# Patient Record
Sex: Female | Born: 1955 | Race: White | Hispanic: No | Marital: Married | Smoking: Current every day smoker
Health system: Southern US, Community
[De-identification: ages and names within clinical notes are randomized; demographics above are authoritative.]

## PROBLEM LIST (undated history)

## (undated) DIAGNOSIS — M1990 Unspecified osteoarthritis, unspecified site: Secondary | ICD-10-CM

## (undated) HISTORY — PX: EYE SURGERY: SHX253

## (undated) HISTORY — PX: HAND SURGERY: SHX662

---

## 2004-08-27 ENCOUNTER — Other Ambulatory Visit: Admission: RE | Admit: 2004-08-27 | Discharge: 2004-08-27 | Payer: Self-pay | Admitting: Obstetrics and Gynecology

## 2004-09-24 ENCOUNTER — Ambulatory Visit (HOSPITAL_COMMUNITY): Admission: RE | Admit: 2004-09-24 | Discharge: 2004-09-24 | Payer: Self-pay | Admitting: Obstetrics and Gynecology

## 2004-10-08 ENCOUNTER — Encounter: Admission: RE | Admit: 2004-10-08 | Discharge: 2004-10-08 | Payer: Self-pay | Admitting: Obstetrics and Gynecology

## 2005-04-21 ENCOUNTER — Encounter: Admission: RE | Admit: 2005-04-21 | Discharge: 2005-04-21 | Payer: Self-pay | Admitting: Obstetrics and Gynecology

## 2005-09-29 ENCOUNTER — Encounter: Admission: RE | Admit: 2005-09-29 | Discharge: 2005-09-29 | Payer: Self-pay | Admitting: Obstetrics and Gynecology

## 2005-10-13 ENCOUNTER — Encounter: Admission: RE | Admit: 2005-10-13 | Discharge: 2005-10-13 | Payer: Self-pay | Admitting: Obstetrics and Gynecology

## 2006-11-01 ENCOUNTER — Ambulatory Visit (HOSPITAL_COMMUNITY): Admission: RE | Admit: 2006-11-01 | Discharge: 2006-11-01 | Payer: Self-pay | Admitting: Orthopedic Surgery

## 2006-11-17 ENCOUNTER — Encounter: Admission: RE | Admit: 2006-11-17 | Discharge: 2006-11-17 | Payer: Self-pay | Admitting: Internal Medicine

## 2008-08-31 ENCOUNTER — Encounter: Payer: Self-pay | Admitting: Rheumatology

## 2009-03-15 ENCOUNTER — Encounter: Admission: RE | Admit: 2009-03-15 | Discharge: 2009-03-15 | Payer: Self-pay | Admitting: Orthopedic Surgery

## 2009-10-08 ENCOUNTER — Ambulatory Visit (HOSPITAL_COMMUNITY): Admission: RE | Admit: 2009-10-08 | Discharge: 2009-10-08 | Payer: Self-pay | Admitting: Rheumatology

## 2009-10-15 ENCOUNTER — Encounter: Admission: RE | Admit: 2009-10-15 | Discharge: 2009-10-15 | Payer: Self-pay | Admitting: Orthopedic Surgery

## 2009-11-06 ENCOUNTER — Ambulatory Visit (HOSPITAL_BASED_OUTPATIENT_CLINIC_OR_DEPARTMENT_OTHER): Admission: RE | Admit: 2009-11-06 | Discharge: 2009-11-06 | Payer: Self-pay | Admitting: Orthopedic Surgery

## 2010-08-31 ENCOUNTER — Encounter: Payer: Self-pay | Admitting: Internal Medicine

## 2010-08-31 ENCOUNTER — Encounter: Payer: Self-pay | Admitting: Obstetrics and Gynecology

## 2011-12-13 DIAGNOSIS — H16069 Mycotic corneal ulcer, unspecified eye: Secondary | ICD-10-CM | POA: Insufficient documentation

## 2012-02-22 DIAGNOSIS — T86840 Corneal transplant rejection: Secondary | ICD-10-CM

## 2012-05-04 DIAGNOSIS — M069 Rheumatoid arthritis, unspecified: Secondary | ICD-10-CM | POA: Insufficient documentation

## 2012-08-12 DIAGNOSIS — Z947 Corneal transplant status: Secondary | ICD-10-CM | POA: Insufficient documentation

## 2013-03-13 DIAGNOSIS — H26499 Other secondary cataract, unspecified eye: Secondary | ICD-10-CM | POA: Insufficient documentation

## 2013-07-31 DIAGNOSIS — D2310 Other benign neoplasm of skin of unspecified eyelid, including canthus: Secondary | ICD-10-CM | POA: Insufficient documentation

## 2013-07-31 DIAGNOSIS — H02409 Unspecified ptosis of unspecified eyelid: Secondary | ICD-10-CM | POA: Insufficient documentation

## 2013-09-06 DIAGNOSIS — Z961 Presence of intraocular lens: Secondary | ICD-10-CM | POA: Insufficient documentation

## 2014-01-05 DIAGNOSIS — H04209 Unspecified epiphora, unspecified lacrimal gland: Secondary | ICD-10-CM | POA: Insufficient documentation

## 2014-01-18 DIAGNOSIS — H401190 Primary open-angle glaucoma, unspecified eye, stage unspecified: Secondary | ICD-10-CM | POA: Insufficient documentation

## 2014-01-18 DIAGNOSIS — H04563 Stenosis of bilateral lacrimal punctum: Secondary | ICD-10-CM | POA: Insufficient documentation

## 2014-02-16 DIAGNOSIS — H40003 Preglaucoma, unspecified, bilateral: Secondary | ICD-10-CM | POA: Insufficient documentation

## 2014-07-04 ENCOUNTER — Other Ambulatory Visit (HOSPITAL_COMMUNITY): Payer: Self-pay | Admitting: Rheumatology

## 2014-07-04 DIAGNOSIS — M659 Unspecified synovitis and tenosynovitis, unspecified site: Secondary | ICD-10-CM

## 2014-07-19 ENCOUNTER — Ambulatory Visit (HOSPITAL_COMMUNITY): Payer: BC Managed Care – PPO

## 2014-08-15 ENCOUNTER — Ambulatory Visit (HOSPITAL_COMMUNITY)
Admission: RE | Admit: 2014-08-15 | Discharge: 2014-08-15 | Disposition: A | Discharge status: Home / Self Care | Payer: BLUE CROSS/BLUE SHIELD | Source: Ambulatory Visit | Attending: Rheumatology | Admitting: Rheumatology

## 2014-08-15 DIAGNOSIS — M7989 Other specified soft tissue disorders: Secondary | ICD-10-CM | POA: Insufficient documentation

## 2014-08-15 DIAGNOSIS — M659 Unspecified synovitis and tenosynovitis, unspecified site: Secondary | ICD-10-CM

## 2014-08-15 DIAGNOSIS — M25571 Pain in right ankle and joints of right foot: Secondary | ICD-10-CM | POA: Diagnosis not present

## 2016-08-10 HISTORY — PX: FOOT SURGERY: SHX648

## 2016-10-13 ENCOUNTER — Encounter: Payer: Self-pay | Admitting: Rheumatology

## 2016-10-13 ENCOUNTER — Ambulatory Visit (INDEPENDENT_AMBULATORY_CARE_PROVIDER_SITE_OTHER): Payer: BLUE CROSS/BLUE SHIELD | Admitting: Rheumatology

## 2016-10-13 VITALS — BP 131/61 | HR 70 | Resp 13 | Ht 66.0 in | Wt 193.0 lb

## 2016-10-13 DIAGNOSIS — M158 Other polyosteoarthritis: Secondary | ICD-10-CM

## 2016-10-13 DIAGNOSIS — M069 Rheumatoid arthritis, unspecified: Secondary | ICD-10-CM

## 2016-10-13 DIAGNOSIS — Z79899 Other long term (current) drug therapy: Secondary | ICD-10-CM

## 2016-10-13 DIAGNOSIS — M159 Polyosteoarthritis, unspecified: Secondary | ICD-10-CM | POA: Insufficient documentation

## 2016-10-13 DIAGNOSIS — Z8679 Personal history of other diseases of the circulatory system: Secondary | ICD-10-CM

## 2016-10-13 DIAGNOSIS — M2560 Stiffness of unspecified joint, not elsewhere classified: Secondary | ICD-10-CM

## 2016-10-13 DIAGNOSIS — Z947 Corneal transplant status: Secondary | ICD-10-CM | POA: Diagnosis not present

## 2016-10-13 DIAGNOSIS — M0579 Rheumatoid arthritis with rheumatoid factor of multiple sites without organ or systems involvement: Secondary | ICD-10-CM

## 2016-10-13 DIAGNOSIS — Z8659 Personal history of other mental and behavioral disorders: Secondary | ICD-10-CM

## 2016-10-13 LAB — CBC WITH DIFFERENTIAL/PLATELET
BASOS PCT: 1 %
Basophils Absolute: 68 cells/uL (ref 0–200)
EOS ABS: 340 {cells}/uL (ref 15–500)
Eosinophils Relative: 5 %
HEMATOCRIT: 40.5 % (ref 35.0–45.0)
Hemoglobin: 13.3 g/dL (ref 11.7–15.5)
LYMPHS ABS: 1972 {cells}/uL (ref 850–3900)
Lymphocytes Relative: 29 %
MCH: 31.7 pg (ref 27.0–33.0)
MCHC: 32.8 g/dL (ref 32.0–36.0)
MCV: 96.4 fL (ref 80.0–100.0)
MONO ABS: 544 {cells}/uL (ref 200–950)
MPV: 9.2 fL (ref 7.5–12.5)
Monocytes Relative: 8 %
NEUTROS ABS: 3876 {cells}/uL (ref 1500–7800)
Neutrophils Relative %: 57 %
Platelets: 308 10*3/uL (ref 140–400)
RBC: 4.2 MIL/uL (ref 3.80–5.10)
RDW: 13.8 % (ref 11.0–15.0)
WBC: 6.8 10*3/uL (ref 3.8–10.8)

## 2016-10-13 LAB — COMPLETE METABOLIC PANEL WITH GFR
ALT: 13 U/L (ref 6–29)
AST: 15 U/L (ref 10–35)
Albumin: 3.7 g/dL (ref 3.6–5.1)
Alkaline Phosphatase: 61 U/L (ref 33–130)
BILIRUBIN TOTAL: 0.2 mg/dL (ref 0.2–1.2)
BUN: 18 mg/dL (ref 7–25)
CALCIUM: 9 mg/dL (ref 8.6–10.4)
CO2: 28 mmol/L (ref 20–31)
CREATININE: 0.66 mg/dL (ref 0.50–1.05)
Chloride: 105 mmol/L (ref 98–110)
GFR, Est Non African American: >89 mL/min (ref >=60)
Glucose, Bld: 82 mg/dL (ref 65–99)
Potassium: 4.7 mmol/L (ref 3.5–5.3)
Sodium: 139 mmol/L (ref 135–146)
TOTAL PROTEIN: 6.2 g/dL (ref 6.1–8.1)

## 2016-10-13 MED ORDER — SULFASALAZINE 500 MG PO TABS: 500.0000 mg | ORAL_TABLET | Freq: Four times a day (QID) | ORAL | 2 refills | Status: DC

## 2016-10-13 MED ORDER — PREDNISONE 5 MG PO TABS: ORAL_TABLET | ORAL | 0 refills | Status: DC

## 2016-10-13 NOTE — Progress Notes (Signed)
Office Visit Note  Patient: Jamie Mccall             Date of Birth: 1956-06-08           MRN: [id number]             PCP: Tula Nakayama Referring: No ref. provider found Visit Date: 10/13/2016 Occupation: @GUAROCC @    Subjective:  Pain of the Left Ankle   History of Present Illness: Jamie Mccall is a 61 y.o. female  Last seen in our office 02/01/2016.  Patient is off of all medications for immunosuppression for her rheumatoid arthritis.  Note that she has failed Humira in the past. She is also failed Enbrel. She initially had done well for about 2 years and then she stopped the medication she was doing so well. Then she restarted the medication and came down with pneumonia. She was "deathly sick" according to the patient. She does not wish to be on Biologics at this time. She is very afraid of catching pneumonia.  Currently she is better having left medial ankle pain and it's causing her problems ambulating.  Dr. Estanislado Pandy offered her Plaquenil back in January 2017 and she signed a handout and consent but patient elected not to take the medication because she states "it was ineffective for me in 2009".   Activities of Daily Living:  Patient reports morning stiffness for 30 minutes.   Patient Reports nocturnal pain.  Difficulty dressing/grooming: Reports Difficulty climbing stairs: Reports Difficulty getting out of chair: Reports Difficulty using hands for taps, buttons, cutlery, and/or writing: Reports   Review of Systems  Constitutional: Negative for fatigue.  HENT: Negative for mouth sores and mouth dryness.   Eyes: Negative for dryness.  Respiratory: Negative for shortness of breath.   Gastrointestinal: Negative for constipation and diarrhea.  Musculoskeletal: Negative for myalgias and myalgias.  Skin: Negative for sensitivity to sunlight.  Psychiatric/Behavioral: Negative for decreased concentration and sleep disturbance.    PMFS History:    Patient Active Problem List   Diagnosis Date Noted  . 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[facility]) 10/13/2016  . History of corneal transplant 10/13/2016  . High risk medication use 10/13/2016  . Degenerative joint disease involving multiple joints 10/13/2016  . History of hypertension 10/13/2016  . History of depression 10/13/2016  . Morning joint stiffness 10/13/2016    No past medical history on file.  No family history on file. No past surgical history on file. Social History   Social History Narrative  . No narrative on file     Objective: Vital Signs: BP 131/61   Pulse 70   Resp 13   Ht 5\' 6"  (1.676 m)   Wt 193 lb (87.5 kg)   BMI 31.15 kg/m    Physical Exam  Constitutional: She is oriented to person, place, and time. She appears well-developed and well-nourished.  HENT:  Head: Normocephalic and atraumatic.  Eyes: EOM are normal. Pupils are equal, round, and reactive to light.  Cardiovascular: Normal rate, regular rhythm and normal heart sounds.  Exam reveals no gallop and no friction rub.   No murmur heard. Pulmonary/Chest: Effort normal and breath sounds normal. She has no wheezes. She has no rales.  Abdominal: Soft. Bowel sounds are normal. She exhibits no distension. There is no tenderness. There is no guarding. No hernia.  Musculoskeletal: Normal range of motion. She exhibits no edema, tenderness or deformity.  Lymphadenopathy:    She has no cervical adenopathy.  Neurological: She is alert and oriented  to person, place, and time. Coordination normal.  Skin: Skin is warm and dry. Capillary refill takes less than 2 seconds. No rash noted.  Psychiatric: She has a normal mood and affect. Her behavior is normal.  Nursing note and vitals reviewed.    Musculoskeletal Exam:  Full range of motion of all joints Grip strength is equal and strong bilaterally For myalgia tender points are absent  CDAI Exam: CDAI Homunculus Exam:   Tenderness:  Right hand:  1st MCP, 2nd MCP and 3rd MCP Left hand: 1st MCP, 2nd MCP and 3rd MCP  Swelling:  Right hand: 1st MCP, 2nd MCP and 3rd MCP Left hand: 1st MCP, 2nd MCP and 3rd MCP  Joint Counts:  CDAI Tender Joint count: 6 CDAI Swollen Joint count: 6  Global Assessments:  Patient Global Assessment: 8 Provider Global Assessment: 8  CDAI Calculated Score: 28    Investigation: Findings:  May 2016:  CBC and comprehensive metabolic panel was normal  Last bone density was in 2013 with a T score of -1.8.  No visits with results within 6 Month(s) from this visit.  Latest known visit with results is:  Hospital Outpatient Visit on 11/06/2009  Component Date Value Ref Range Status  . Hemoglobin 11/06/2009 14.5  12.0 - 15.0 g/dL Final      Imaging: No results found.  Speciality Comments: 06/27/14 DR. DEV ONLY -KBD  pt states she will not use Enbrel again/ causes Bronchitis,  will discuss other options in Jan 2017    Procedures:  No procedures performed Allergies: Patient has no known allergies.   Assessment / Plan:     Visit Diagnoses: Rheumatoid arthritis involving multiple sites, unspecified rheumatoid factor presence (HCC) - +CCP,+ANA, +ARNP  History of corneal transplant - Right eye  High risk medication use - ?PLQ-200mg  1 tablet q.a.m. and half tablet p.m - Plan: CBC with Differential/Platelet, COMPLETE METABOLIC PANEL WITH GFR, CBC with Differential/Platelet, COMPLETE METABOLIC PANEL WITH GFR  Other osteoarthritis involving multiple joints  History of hypertension  History of depression  Morning joint stiffness    Plan: #1: Rheumatoid arthritis. Patient has failed Humira and Enbrel in the past. Patient also states that she ended up getting pneumonia and "deathly sick" when she last took her Enbrel. She told Dr. Estanislado Pandy back on 09/03/2015 visit that she wants to stay off of Biologics. She also states that the Plaquenil was ineffective for her in 2009 does not want  Plaquenil. 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  #2: Currently she is off of all immunosuppressives. She states "I need to do something". Dr. Estanislado Pandy offered her sulfasalazine. Patient states "I do not want to get pneumonia". As a result putting her on any kind of Biologics including Orencia orders Morrie Sheldon will not be a good option.  Patient's G6PD was 13 on labs done 10/08/2009. We will do a handout and consent on sulfasalazine for this patient. I will go ahead and do a step standing CBC with differential CMP with GFR today and then one again in a month and then once every 3 months thereafter.  She'll have sulfasalazine 500 mg 2 pills twice a day. I will start her at 1 pill a day for 3 days then 1 pill twice a day for 3 days then 1 pill 3 times a day for 3 days and then 2 pills twice a day thereafter.  #3: Left  ankle with medius aspects warm and swollen for the last 1 or 2 weeks making it difficult for the patient to her in her livelihood as a Lobbyist. Patient states "I need something". She states "I would not be here otherwise".  #4: Return to clinic in 3 months  #5: Prednisone taper Prednisone 5 mg 4 pills for 4 days 3 for 4 days 2 for 4 days One for 4 days then stop  #6: Return to clinic in 3 months  #7: I offered the patient ultrasound of bilateral hands in the left medial ankle to rule out synovitis. Patient declined ultrasound.   Orders: Orders Placed This Encounter  Procedures  . CBC with Differential/Platelet  . COMPLETE METABOLIC PANEL WITH GFR   Meds ordered this encounter  Medications  . sulfaSALAzine (AZULFIDINE) 500 MG tablet    Sig: Take 1 tablet (500 mg total) by mouth 4 (four) times daily.    Dispense:  120 tablet    Refill:  2    Order Specific Question:   Supervising Provider    Answer:   Bo Merino [[id number]]  . predniSONE  (DELTASONE) 5 MG tablet    Sig: OK to Prescribe Prednisone 5mg :  4po qAM x 3 days,  3po qAM x 3 days, 2po qAM x 3 days, 1po qAM x 3 days, 1/2po qAM x 3 days, then stop. disp 32 pills w/ no refills.    Dispense:  32 tablet    Refill:  0    Order Specific Question:   Supervising Provider    Answer:   Bo Merino [phone]    Face-to-face time spent with patient was 30 minutes. 50% of time was spent in counseling and coordination of care.  Follow-Up Instructions: No Follow-up on file.   Eliezer Lofts, PA-C  I had detailed discussion with the patient along with Mr. Carlyon Shadow. On exam she had some swelling in her left ankle indicating ongoing synovitis. Different treatment options were discussed. She does not want any strong immunosuppressive agents as she is concerned about increased risk of infection. She did not like the side effects of methotrexate or any of the Biologics. After detailed discussion she agreed to proceed with sulfasalazine. I examined and evaluated the patient with Eliezer Lofts PA. The plan of care was discussed as noted above.  Bo Merino, MD Note - This record has been created using Editor, commissioning.  Chart creation errors have been sought, but may not always  have been located. Such creation errors do not reflect on  the standard of medical care.

## 2016-10-13 NOTE — Patient Instructions (Addendum)
Sulfasalazine instructions: 1 pill daily for 3 days then 1 pill twice a day for 3 days then 1 pill 3 times a day for 3 days and then 2 pills twice a day thereafter  Sulfasalazine delayed release tablets What is this medicine? SULFASALAZINE (sul fa SAL a zeen) is for ulcerative colitis and certain types of rheumatoid arthritis. This medicine may be used for other purposes; ask your health care provider or pharmacist if you have questions. COMMON BRAND NAME(S): Azulfidine En-Tabs, Sulfazine EC What should I tell my health care provider before I take this medicine? They need to know if you have any of these conditions: -asthma -blood disorders or anemia -glucose-6-phosphate dehydrogenase (G6PD) deficiency -intestinal obstruction -kidney disease -liver disease -porphyria -urinary tract obstruction -an unusual reaction to sulfasalazine, sulfa drugs, salicylates, other medicines, foods, dyes, or preservatives -pregnant or trying to get pregnant -breast-feeding How should I use this medicine? Take this medicine by mouth with a full glass of water. Follow the directions on the prescription label. If the medicine upsets your stomach, take it with food or milk. Do not crush or chew the tablets. Swallow the tablets whole. Take your medicine at regular intervals. Do not take your medicine more often than directed. Do not stop taking except on your doctor's advice. Talk to your pediatrician regarding the use of this medicine in children. Special care may be needed. While this drug may be prescribed for children as young as 6 years for selected conditions, precautions do apply. Patients over 71 years old may have a stronger reaction and need a smaller dose. Overdosage: If you think you have taken too much of this medicine contact a poison control center or emergency room at once. NOTE: This medicine is only for you. Do not share this medicine with others. What if I miss a dose? If you miss a dose, take  it as soon as you can. If it is almost time for your next dose, take only that dose. Do not take double or extra doses. What may interact with this medicine? -digoxin -folic acid This list may not describe all possible interactions. Give your health care provider a list of all the medicines, herbs, non-prescription drugs, or dietary supplements you use. Also tell them if you smoke, drink alcohol, or use illegal drugs. Some items may interact with your medicine. What should I watch for while using this medicine? Visit your doctor or health care professional for regular checks on your progress. You will need frequent blood and urine checks. This medicine can make you more sensitive to the sun. Keep out of the sun. If you cannot avoid being in the sun, wear protective clothing and use sunscreen. Do not use sun lamps or tanning beds/booths. Drink plenty of water while taking this medicine. Tell your doctor if you see the tablet in your stools. Your body may not be absorbing the medicine. What side effects may I notice from receiving this medicine? Side effects that you should report to your doctor or health care professional as soon as possible: -allergic reactions like skin rash, itching or hives, swelling of the face, lips, or tongue -fever, chills, or any other sign of infection -painful, difficult, or reduced urination -redness, blistering, peeling or loosening of the skin, including inside the mouth -severe stomach pain -unusual bleeding or bruising -unusually weak or tired -yellowing of the skin or eyes Side effects that usually do not require medical attention (report to your doctor or health care professional if they continue  or are bothersome): -headache -loss of appetite -nausea, vomiting -orange color to the urine -reduced sperm count This list may not describe all possible side effects. Call your doctor for medical advice about side effects. You may report side effects to FDA at  1-800-FDA-1088. Where should I keep my medicine? Keep out of the reach of children. Store at room temperature between 15 and 30 degrees C (59 and 86 degrees F). Keep container tightly closed. Throw away any unused medicine after the expiration date. NOTE: This sheet is a summary. It may not cover all possible information. If you have questions about this medicine, talk to your doctor, pharmacist, or health care provider.  2018 Elsevier/Gold Standard (2008-03-30 13:02:26)

## 2016-10-13 NOTE — Progress Notes (Signed)
Pharmacy Note  Subjective:  Patient presents today to the [facility] to see Dr. Lacy Duverney. [place].  Patient seen by pharmacist for counseling on sulfasalazine.    Objective: CMP, CBC drawn today  G6PD: 13 (reference range 7-20) on 10/08/2009  Assessment/Plan:   Patient was counseled on the purpose, proper use, and adverse effects of sulfasalazine including chance of nausea, headache, and sun sensitivity.  Also discussed risk of skin rash and advised patient to stop the medication and let us know if she develops a rash. Also discussed for the potential of discoloration of the urine, sweat, or tears.  Reviewed the importance of frequent labs to monitor liver, kidneys, and blood counts; and patient voiced understanding.  Provided patient with educational materials on sulfasalazine and answered all questions.  Patient consented to sulfasalazine use.    Patient was advised to schedule ultrasound as requested by Mr. Carlyon Shadow.  Patient reports she does not want to get an ultrasound.  I reviewed the purpose of ultrasound, but patient still declined stating she did not want an ultrasound at this time.     Elisabeth Most, Pharm.D., BCPS Clinical Pharmacist Pager: [phone] Phone: [phone] 10/13/2016 3:20 PM

## 2016-10-19 ENCOUNTER — Other Ambulatory Visit: Payer: Self-pay | Admitting: Orthopedic Surgery

## 2016-11-03 ENCOUNTER — Encounter (HOSPITAL_BASED_OUTPATIENT_CLINIC_OR_DEPARTMENT_OTHER): Payer: Self-pay | Admitting: *Deleted

## 2016-11-05 ENCOUNTER — Ambulatory Visit (HOSPITAL_BASED_OUTPATIENT_CLINIC_OR_DEPARTMENT_OTHER): Payer: BLUE CROSS/BLUE SHIELD | Admitting: Anesthesiology

## 2016-11-05 ENCOUNTER — Encounter (HOSPITAL_BASED_OUTPATIENT_CLINIC_OR_DEPARTMENT_OTHER): Payer: Self-pay | Admitting: Certified Registered"

## 2016-11-05 ENCOUNTER — Ambulatory Visit (HOSPITAL_BASED_OUTPATIENT_CLINIC_OR_DEPARTMENT_OTHER)
Admission: RE | Admit: 2016-11-05 | Discharge: 2016-11-05 | Disposition: A | Discharge status: Home / Self Care | Payer: BLUE CROSS/BLUE SHIELD | Source: Ambulatory Visit | Attending: Orthopedic Surgery | Admitting: Orthopedic Surgery

## 2016-11-05 ENCOUNTER — Encounter (HOSPITAL_BASED_OUTPATIENT_CLINIC_OR_DEPARTMENT_OTHER)
Admission: RE | Disposition: A | Discharge status: Home / Self Care | Payer: Self-pay | Source: Ambulatory Visit | Attending: Orthopedic Surgery

## 2016-11-05 DIAGNOSIS — Z6831 Body mass index (BMI) 31.0-31.9, adult: Secondary | ICD-10-CM | POA: Insufficient documentation

## 2016-11-05 DIAGNOSIS — M2022 Hallux rigidus, left foot: Secondary | ICD-10-CM | POA: Insufficient documentation

## 2016-11-05 DIAGNOSIS — E669 Obesity, unspecified: Secondary | ICD-10-CM | POA: Diagnosis not present

## 2016-11-05 DIAGNOSIS — Z9889 Other specified postprocedural states: Secondary | ICD-10-CM

## 2016-11-05 DIAGNOSIS — M069 Rheumatoid arthritis, unspecified: Secondary | ICD-10-CM | POA: Diagnosis not present

## 2016-11-05 DIAGNOSIS — F17210 Nicotine dependence, cigarettes, uncomplicated: Secondary | ICD-10-CM | POA: Diagnosis not present

## 2016-11-05 DIAGNOSIS — R229 Localized swelling, mass and lump, unspecified: Secondary | ICD-10-CM | POA: Diagnosis present

## 2016-11-05 DIAGNOSIS — Z79899 Other long term (current) drug therapy: Secondary | ICD-10-CM | POA: Diagnosis not present

## 2016-11-05 DIAGNOSIS — M71372 Other bursal cyst, left ankle and foot: Secondary | ICD-10-CM | POA: Insufficient documentation

## 2016-11-05 HISTORY — DX: Unspecified osteoarthritis, unspecified site: M19.90

## 2016-11-05 HISTORY — PX: MASS EXCISION: SHX2000

## 2016-11-05 SURGERY — EXCISION MASS
Anesthesia: General | Site: Foot | Laterality: Left

## 2016-11-05 MED ORDER — LIDOCAINE HCL (CARDIAC) 20 MG/ML IV SOLN
INTRAVENOUS | Status: DC | PRN
  Administered 2016-11-05: 60 mg via INTRAVENOUS

## 2016-11-05 MED ORDER — PROPOFOL 10 MG/ML IV BOLUS
INTRAVENOUS | Status: DC | PRN
  Administered 2016-11-05: 200 mg via INTRAVENOUS

## 2016-11-05 MED ORDER — CEFAZOLIN SODIUM-DEXTROSE 2-4 GM/100ML-% IV SOLN
INTRAVENOUS | Status: AC
  Filled 2016-11-05: qty 100

## 2016-11-05 MED ORDER — ONDANSETRON HCL 4 MG/2ML IJ SOLN
INTRAMUSCULAR | Status: DC | PRN
  Administered 2016-11-05: 4 mg via INTRAVENOUS

## 2016-11-05 MED ORDER — PROMETHAZINE HCL 25 MG/ML IJ SOLN: 6.2500 mg | INTRAMUSCULAR | Status: DC | PRN

## 2016-11-05 MED ORDER — MIDAZOLAM HCL 2 MG/2ML IJ SOLN
1.0000 mg | INTRAMUSCULAR | Status: DC | PRN
Start: 2016-11-05 — End: 2016-11-05
  Administered 2016-11-05: 2 mg via INTRAVENOUS

## 2016-11-05 MED ORDER — SCOPOLAMINE 1 MG/3DAYS TD PT72: 1.0000 | MEDICATED_PATCH | Freq: Once | TRANSDERMAL | Status: DC | PRN

## 2016-11-05 MED ORDER — CHLORHEXIDINE GLUCONATE 4 % EX LIQD: 60.0000 mL | Freq: Once | CUTANEOUS | Status: DC

## 2016-11-05 MED ORDER — LACTATED RINGERS IV SOLN
INTRAVENOUS | Status: DC
  Administered 2016-11-05 (×3): via INTRAVENOUS

## 2016-11-05 MED ORDER — SODIUM CHLORIDE 0.9 % IV SOLN: INTRAVENOUS | Status: DC

## 2016-11-05 MED ORDER — DEXAMETHASONE SODIUM PHOSPHATE 10 MG/ML IJ SOLN
INTRAMUSCULAR | Status: DC | PRN
  Administered 2016-11-05: 10 mg via INTRAVENOUS

## 2016-11-05 MED ORDER — HYDROCODONE-ACETAMINOPHEN 5-325 MG PO TABS: 1.0000 | ORAL_TABLET | ORAL | 0 refills | Status: DC | PRN

## 2016-11-05 MED ORDER — FENTANYL CITRATE (PF) 100 MCG/2ML IJ SOLN: 25.0000 ug | INTRAMUSCULAR | Status: DC | PRN

## 2016-11-05 MED ORDER — BUPIVACAINE-EPINEPHRINE 0.5% -1:200000 IJ SOLN
INTRAMUSCULAR | Status: DC | PRN
  Administered 2016-11-05: 8 mL

## 2016-11-05 MED ORDER — CEFAZOLIN SODIUM-DEXTROSE 2-4 GM/100ML-% IV SOLN
2.0000 g | INTRAVENOUS | Status: AC
  Administered 2016-11-05: 2 g via INTRAVENOUS

## 2016-11-05 MED ORDER — MIDAZOLAM HCL 2 MG/2ML IJ SOLN
INTRAMUSCULAR | Status: AC
  Filled 2016-11-05: qty 2

## 2016-11-05 MED ORDER — PROPOFOL 500 MG/50ML IV EMUL
INTRAVENOUS | Status: AC
  Filled 2016-11-05: qty 100

## 2016-11-05 MED ORDER — DOCUSATE SODIUM 100 MG PO CAPS: 100.0000 mg | ORAL_CAPSULE | Freq: Two times a day (BID) | ORAL | 0 refills | Status: DC

## 2016-11-05 MED ORDER — FENTANYL CITRATE (PF) 100 MCG/2ML IJ SOLN
50.0000 ug | INTRAMUSCULAR | Status: DC | PRN
  Administered 2016-11-05 (×2): 50 ug via INTRAVENOUS

## 2016-11-05 MED ORDER — SENNA 8.6 MG PO TABS: 2.0000 | ORAL_TABLET | Freq: Two times a day (BID) | ORAL | 0 refills | Status: DC

## 2016-11-05 MED ORDER — FENTANYL CITRATE (PF) 100 MCG/2ML IJ SOLN
INTRAMUSCULAR | Status: AC
  Filled 2016-11-05: qty 2

## 2016-11-05 SURGICAL SUPPLY — 67 items
BANDAGE ESMARK 6X9 LF (GAUZE/BANDAGES/DRESSINGS) IMPLANT
BLADE ARTHRO LOK 4 BEAVER (BLADE) IMPLANT
BLADE ARTHRO LOK 4MM BEAVER (BLADE)
BLADE SURG 15 STRL LF DISP TIS (BLADE) ×1 IMPLANT
BLADE SURG 15 STRL SS (BLADE) ×2
BNDG COHESIVE 4X5 TAN STRL (GAUZE/BANDAGES/DRESSINGS) ×3 IMPLANT
BNDG COHESIVE 6X5 TAN STRL LF (GAUZE/BANDAGES/DRESSINGS) IMPLANT
BNDG CONFORM 3 STRL LF (GAUZE/BANDAGES/DRESSINGS) IMPLANT
BNDG ESMARK 4X9 LF (GAUZE/BANDAGES/DRESSINGS) ×3 IMPLANT
BNDG ESMARK 6X9 LF (GAUZE/BANDAGES/DRESSINGS)
CHLORAPREP W/TINT 26ML (MISCELLANEOUS) ×3 IMPLANT
CLOSURE WOUND 1/2 X4 (GAUZE/BANDAGES/DRESSINGS)
COVER BACK TABLE 60X90IN (DRAPES) ×3 IMPLANT
CUFF TOURNIQUET SINGLE 24IN (TOURNIQUET CUFF) IMPLANT
CUFF TOURNIQUET SINGLE 34IN LL (TOURNIQUET CUFF) IMPLANT
DRAPE EXTREMITY T 121X128X90 (DRAPE) ×3 IMPLANT
DRAPE OEC MINIVIEW 54X84 (DRAPES) IMPLANT
DRAPE SURG 17X23 STRL (DRAPES) IMPLANT
DRAPE U-SHAPE 47X51 STRL (DRAPES) IMPLANT
DRSG MEPITEL 4X7.2 (GAUZE/BANDAGES/DRESSINGS) ×3 IMPLANT
DRSG PAD ABDOMINAL 8X10 ST (GAUZE/BANDAGES/DRESSINGS) ×3 IMPLANT
ELECT REM PT RETURN 9FT ADLT (ELECTROSURGICAL) ×3
ELECTRODE REM PT RTRN 9FT ADLT (ELECTROSURGICAL) ×1 IMPLANT
GAUZE SPONGE 4X4 12PLY STRL (GAUZE/BANDAGES/DRESSINGS) ×6 IMPLANT
GAUZE SPONGE 4X4 16PLY XRAY LF (GAUZE/BANDAGES/DRESSINGS) IMPLANT
GLOVE BIO SURGEON STRL SZ8 (GLOVE) ×3 IMPLANT
GLOVE BIOGEL PI IND STRL 7.0 (GLOVE) ×1 IMPLANT
GLOVE BIOGEL PI IND STRL 8 (GLOVE) ×2 IMPLANT
GLOVE BIOGEL PI INDICATOR 7.0 (GLOVE) ×2
GLOVE BIOGEL PI INDICATOR 8 (GLOVE) ×4
GLOVE ECLIPSE 7.0 STRL STRAW (GLOVE) ×3 IMPLANT
GLOVE ECLIPSE 8.0 STRL XLNG CF (GLOVE) ×3 IMPLANT
GOWN STRL REUS W/ TWL LRG LVL3 (GOWN DISPOSABLE) ×1 IMPLANT
GOWN STRL REUS W/ TWL XL LVL3 (GOWN DISPOSABLE) ×2 IMPLANT
GOWN STRL REUS W/TWL LRG LVL3 (GOWN DISPOSABLE) ×2
GOWN STRL REUS W/TWL XL LVL3 (GOWN DISPOSABLE) ×4
NEEDLE HYPO 22GX1.5 SAFETY (NEEDLE) IMPLANT
NEEDLE HYPO 25X1 1.5 SAFETY (NEEDLE) IMPLANT
NS IRRIG 1000ML POUR BTL (IV SOLUTION) ×3 IMPLANT
PACK BASIN DAY SURGERY FS (CUSTOM PROCEDURE TRAY) ×3 IMPLANT
PAD CAST 4YDX4 CTTN HI CHSV (CAST SUPPLIES) ×1 IMPLANT
PADDING CAST ABS 4INX4YD NS (CAST SUPPLIES)
PADDING CAST ABS COTTON 4X4 ST (CAST SUPPLIES) IMPLANT
PADDING CAST COTTON 4X4 STRL (CAST SUPPLIES) ×2
PADDING CAST COTTON 6X4 STRL (CAST SUPPLIES) IMPLANT
PENCIL BUTTON HOLSTER BLD 10FT (ELECTRODE) IMPLANT
SANITIZER HAND PURELL 535ML FO (MISCELLANEOUS) ×3 IMPLANT
SHEET MEDIUM DRAPE 40X70 STRL (DRAPES) ×3 IMPLANT
SLEEVE SCD COMPRESS KNEE MED (MISCELLANEOUS) ×3 IMPLANT
SPONGE LAP 18X18 X RAY DECT (DISPOSABLE) ×3 IMPLANT
STOCKINETTE 6  STRL (DRAPES) ×2
STOCKINETTE 6 STRL (DRAPES) ×1 IMPLANT
STRIP CLOSURE SKIN 1/2X4 (GAUZE/BANDAGES/DRESSINGS) IMPLANT
SUCTION FRAZIER HANDLE 10FR (MISCELLANEOUS)
SUCTION TUBE FRAZIER 10FR DISP (MISCELLANEOUS) IMPLANT
SUT ETHILON 3 0 PS 1 (SUTURE) IMPLANT
SUT MNCRL AB 3-0 PS2 18 (SUTURE) ×3 IMPLANT
SUT VIC AB 0 SH 27 (SUTURE) IMPLANT
SUT VIC AB 2-0 SH 27 (SUTURE)
SUT VIC AB 2-0 SH 27XBRD (SUTURE) IMPLANT
SYR BULB 3OZ (MISCELLANEOUS) ×3 IMPLANT
SYR CONTROL 10ML LL (SYRINGE) IMPLANT
TOWEL OR 17X24 6PK STRL BLUE (TOWEL DISPOSABLE) ×3 IMPLANT
TUBE CONNECTING 20'X1/4 (TUBING)
TUBE CONNECTING 20X1/4 (TUBING) IMPLANT
UNDERPAD 30X30 (UNDERPADS AND DIAPERS) ×3 IMPLANT
YANKAUER SUCT BULB TIP NO VENT (SUCTIONS) IMPLANT

## 2016-11-05 NOTE — Anesthesia Preprocedure Evaluation (Addendum)
Anesthesia Evaluation  Patient identified by MRN, date of birth, ID band Patient awake    Reviewed: Allergy & Precautions, NPO status , Patient's Chart, lab work & pertinent test results  Airway Mallampati: II  TM Distance: >3 FB Neck ROM: Full    Dental  (+) Teeth Intact, Dental Advisory Given   Pulmonary Current Smoker,    Pulmonary exam normal breath sounds clear to auscultation       Cardiovascular negative cardio ROS Normal cardiovascular exam Rhythm:Regular Rate:Normal     Neuro/Psych negative neurological ROS     GI/Hepatic negative GI ROS, Neg liver ROS,   Endo/Other  Obesity   Renal/GU negative Renal ROS     Musculoskeletal  (+) Arthritis , Rheumatoid disorders,    Abdominal   Peds  Hematology negative hematology ROS (+)   Anesthesia Other Findings Day of surgery medications reviewed with the patient.  Reproductive/Obstetrics                             Anesthesia Physical Anesthesia Plan  ASA: II  Anesthesia Plan: General   Post-op Pain Management:    Induction: Intravenous  Airway Management Planned: LMA  Additional Equipment:   Intra-op Plan:   Post-operative Plan: Extubation in OR  Informed Consent: I have reviewed the patients History and Physical, chart, labs and discussed the procedure including the risks, benefits and alternatives for the proposed anesthesia with the patient or authorized representative who has indicated his/her understanding and acceptance.   Dental advisory given  Plan Discussed with: CRNA and Anesthesiologist  Anesthesia Plan Comments: (Risks/benefits of general anesthesia discussed with patient including risk of damage to teeth, lips, gum, and tongue, nausea/vomiting, allergic reactions to medications, and the possibility of heart attack, stroke and death.  All patient questions answered.  Patient wishes to proceed.)        Anesthesia Quick Evaluation

## 2016-11-05 NOTE — Transfer of Care (Signed)
Immediate Anesthesia Transfer of Care Note  Patient: Jamie Mccall  Procedure(s) Performed: Procedure(s): Excision of left foot mass (Left)  Patient Location: PACU  Anesthesia Type:General  Level of Consciousness: awake, alert , oriented and patient cooperative  Airway & Oxygen Therapy: Patient Spontanous Breathing and Patient connected to face mask oxygen  Post-op Assessment: Report given to RN and Post -op Vital signs reviewed and stable  Post vital signs: Reviewed and stable  Last Vitals:  Vitals:   11/05/16 1020  BP: (!) 156/71  Pulse: (!) 16  Resp: 16  Temp: 36.8 C    Last Pain:  Vitals:   11/05/16 1020  TempSrc: Oral         Complications: No apparent anesthesia complications

## 2016-11-05 NOTE — Anesthesia Postprocedure Evaluation (Signed)
Anesthesia Post Note  Patient: Jamie Mccall  Procedure(s) Performed: Procedure(s) (LRB): Excision of left foot mass (Left)  Patient location during evaluation: PACU Anesthesia Type: General Level of consciousness: awake and alert Pain management: pain level controlled Vital Signs Assessment: post-procedure vital signs reviewed and stable Respiratory status: spontaneous breathing, nonlabored ventilation, respiratory function stable and patient connected to nasal cannula oxygen Cardiovascular status: blood pressure returned to baseline and stable Postop Assessment: no signs of nausea or vomiting Anesthetic complications: no       Last Vitals:  Vitals:   11/05/16 1151 11/05/16 1224  BP: 138/77 140/62  Pulse: 71 73  Resp: (!) 21 16  Temp: 36.3 C 36.6 C    Last Pain:  Vitals:   11/05/16 1224  TempSrc: Oral                 Catalina Gravel

## 2016-11-05 NOTE — Op Note (Signed)
NAMEMarland Kitchen  MADDALYNN, BARNARD NO.:  [id number]  MEDICAL RECORD NO.:  [id number]  LOCATION:                                 FACILITY:  PHYSICIAN:  Wylene Simmer, MD             DATE OF BIRTH:  DATE OF PROCEDURE:  11/05/2016 DATE OF DISCHARGE:                              OPERATIVE REPORT   PREOPERATIVE DIAGNOSIS:  Left foot mass.  POSTOPERATIVE DIAGNOSES: 1. Left foot mass. 2. Left hallux rigidus.  PROCEDURES: 1. Excisional biopsy of left foot subcutaneous mass (2.5 cm in     diameter). 2. Left first metatarsophalangeal joint cheilectomy.  SURGEON:  Wylene Simmer, M.D.  ASSISTANT:  Mechele Claude, PA-C.  ANESTHESIA:  General.  ESTIMATED BLOOD LOSS:  Minimal.  TOURNIQUET TIME:  22 minutes at 200 mmHg.  COMPLICATIONS:  None apparent.  DISPOSITION:  Extubated, awake, and stable to Recovery.  INDICATIONS FOR PROCEDURE:  The patient is a 61 year old woman with a past medical history significant for rheumatoid arthritis.  She has noticed a slowly growing mass at the medial aspect of her left forefoot adjacent to the medial eminence of the first metatarsal.  She complains of pain with shoe wear.  She has failed nonoperative treatment to date including activity modification, oral anti-inflammatories and shoe wear modification.  She presents today for excisional biopsy of this mass. She understands the risks and benefits of the alternative treatment options and elects surgical treatment.  She specifically understands risks of bleeding, infection, nerve damage, blood clots, need for additional surgery, continued pain, recurrence of the mass, amputation, and death.  DESCRIPTION OF PROCEDURE:  After preoperative consent was obtained and the correct operative site was identified, the patient was brought to the operating room and placed supine on the operating table.  General anesthesia was induced.  Preoperative antibiotics were administered. Surgical time-out was  taken.  Left lower extremity was prepped and draped in standard sterile fashion with a tourniquet around the calf. The foot was exsanguinated and a calf tourniquet was inflated to 200 mmHg.  A longitudinal incision was then made over the mass.  Dissection was carried down through the skin and subcutaneous tissue to the superficial aspect of the mass.  The mass was noted to be well encapsulated and contained a clear gelatinous appearing fluid. Dissection was carried bluntly around the mass in its entirety.  It was noted to emanate from a stalk at the medial joint capsule of the hallux MP joint.  The mass was excised in its entirety and passed off the field as a specimen to Pathology.  The stalk of the mass was traced down into the MP joint.  Immediately evident was a large bony spicule.  The joint capsule was then incised proximally and distally, exposing the metatarsal head.  Degenerative changes were noted consistent with hallux rigidus.  The bony prominence off the medial eminence was then excised with a rongeur.  The head of the metatarsal was then further explored and had no significant mass.  The cut surface of bone was smoothed with a rasp.  Wound was irrigated copiously.  The joint capsule was then repaired with figure-of-eight sutures of 2-0 Vicryl.  Skin incision was closed with horizontal mattress sutures of 3-0 Monocryl.  Skin incision was closed with horizontal mattress sutures of 3-0 nylon.  Subcutaneous tissues were infiltrated with 0.5% Marcaine.  Sterile dressings were applied followed by a compression wrap.  Tourniquet was released after application of dressings at 22 minutes.  The patient was awakened from anesthesia and transported to the recovery room in stable condition.  FOLLOWUP PLAN:  The patient will be weightbearing as tolerated on her left foot in a flat postop shoe.  She will follow up with me in the office in 2 weeks for suture removal and pathology  results.  Mechele Claude, PA-C was present and scrubbed for the entire surgery.  His assistance was essential to timely completion of the case.  He assisted with positioning, prepping and draping, gaining and maintaining exposure, performing the operation and closing and dressing the wounds.   Wylene Simmer, MD     JH/MEDQ  D:  11/05/2016  T:  11/05/2016  Job:  [id number]

## 2016-11-05 NOTE — Discharge Instructions (Addendum)
Jamie Simmer, MD [name]  Please read the following information regarding your care after surgery.  Medications  You only need a prescription for the narcotic pain medicine (ex. oxycodone, Percocet, Norco).  All of the other medicines listed below are available over the counter. X acetominophen (Tylenol) 650 mg every 4-6 hours as you need for minor pain X hydrocodone as prescribed for moderate to severe pain X aleve 220 mg - take 2 tablets twice daily as needed for pain.   Narcotic pain medicine (ex. oxycodone, Percocet, Vicodin) will cause constipation.  To prevent this problem, take the following medicines while you are taking any pain medicine.      Post Anesthesia Home Care Instructions  Activity: Get plenty of rest for the remainder of the day. A responsible individual must stay with you for 24 hours following the procedure.  For the next 24 hours, DO NOT: -Drive a car -Paediatric nurse -Drink alcoholic beverages -Take any medication unless instructed by your physician -Make any legal decisions or sign important papers.  Meals: Start with liquid foods such as gelatin or soup. Progress to regular foods as tolerated. Avoid greasy, spicy, heavy foods. If nausea and/or vomiting occur, drink only clear liquids until the nausea and/or vomiting subsides. Call your physician if vomiting continues.  Special Instructions/Symptoms: Your throat may feel dry or sore from the anesthesia or the breathing tube placed in your throat during surgery. If this causes discomfort, gargle with warm salt water. The discomfort should disappear within 24 hours.  If you had a scopolamine patch placed behind your ear for the management of post- operative nausea and/or vomiting:  1. The medication in the patch is effective for 72 hours, after which it should be removed.  Wrap patch in a tissue and discard in the trash. Wash hands thoroughly with soap and water. 2. You may remove the patch  earlier than 72 hours if you experience unpleasant side effects which may include dry mouth, dizziness or visual disturbances. 3. Avoid touching the patch. Wash your hands with soap and water after contact with the patch.    X docusate sodium (Colace) 100 mg twice a day X senna (Senokot) 2 tablets twice a day     Weight Bearing X Bear weight when you are able on your operated leg or foot in flat post-op shoe.   Dressing X Keep your splint or cast clean and dry.  Dont put anything (coat hanger, pencil, etc) down inside of it.  If it gets damp, use a hair dryer on the cool setting to dry it.  If it gets soaked, call the office to schedule an appointment for a cast change.   After your dressing, cast or splint is removed; you may shower, but do not soak or scrub the wound.  Allow the water to run over it, and then gently pat it dry.  Swelling It is normal for you to have swelling where you had surgery.  To reduce swelling and pain, keep your toes above your nose for at least 3 days after surgery.  It may be necessary to keep your foot or leg elevated for several weeks.  If it hurts, it should be elevated.  Follow Up Call my office at [phone] when you are discharged from the hospital or surgery center to schedule an appointment to be seen two weeks after surgery.  Call my office at [phone] if you develop a fever >101.5 F, nausea, vomiting, bleeding from the surgical site or severe  pain.

## 2016-11-05 NOTE — Brief Op Note (Signed)
11/05/2016  11:29 AM  PATIENT:  Jamie Mccall  61 y.o. female  PRE-OPERATIVE DIAGNOSIS:  Left foot mass   POST-OPERATIVE DIAGNOSIS:  Left foot mass, left hallux rigidus  Procedure(s): 1.  Excisional biopsy of left foot subcutaneous mass (2.5 cm diameter) 2.  Left 1st MTP joint cheilectomy  SURGEON:  Wylene Simmer, MD  ASSISTANT:  Mechele Claude, PA-C  ANESTHESIA:   General  EBL:  minimal   TOURNIQUET:   Total Tourniquet Time Documented: Calf (Left) - 22 minutes Total: Calf (Left) - 22 minutes  COMPLICATIONS:  None apparent  DISPOSITION:  Extubated, awake and stable to recovery.  DICTATION ID:  [id number]

## 2016-11-05 NOTE — Anesthesia Procedure Notes (Signed)
Procedure Name: LMA Insertion Date/Time: 11/05/2016 12:54 AM Performed by: Jazlyn Tippens D Pre-anesthesia Checklist: Patient identified, Emergency Drugs available, Suction available and Patient being monitored Patient Re-evaluated:Patient Re-evaluated prior to inductionOxygen Delivery Method: Circle system utilized Preoxygenation: Pre-oxygenation with 100% oxygen Intubation Type: IV induction Ventilation: Mask ventilation without difficulty LMA: LMA inserted LMA Size: 3.0 Number of attempts: 1 Airway Equipment and Method: Bite block Placement Confirmation: positive ETCO2 Tube secured with: Tape Dental Injury: Teeth and Oropharynx as per pre-operative assessment

## 2016-11-05 NOTE — H&P (Signed)
Jamie Mccall is an 61 y.o. female.   Chief Complaint:  Left forefoot pain HPI:  61 y/o female with left forefoot pain for the last few months.  She notes a mass that has been growing at her medial forefoot.  She has failed non op treatment and presents today for excisional biopsy of the mass.  Past Medical History:  Diagnosis Date  . Arthritis    RA    Past Surgical History:  Procedure Laterality Date  . EYE SURGERY     rt corneal transplants  . HAND SURGERY Right     History reviewed. No pertinent family history. Social History:  reports that she has been smoking.  She has a 15.00 pack-year smoking history. She has never used smokeless tobacco. She reports that she drinks about 0.6 oz of alcohol per week . She reports that she does not use drugs.  Allergies:  Allergies  Allergen Reactions  . Enbrel [Etanercept] Other (See Comments)    "I got pneumonia"    Medications Prior to Admission  Medication Sig Dispense Refill  . Misc Natural Products (GLUCOSAMINE CHOND DOUBLE STR PO) Take by mouth.    . Misc Natural Products (OSTEO BI-FLEX JOINT SHIELD PO) Take by mouth.    . Misc Natural Products (TURMERIC CURCUMIN) CAPS Take by mouth.    . Omega-3 Fatty Acids (FISH OIL OMEGA-3) 1000 MG CAPS Take by mouth.    . sulfaSALAzine (AZULFIDINE) 500 MG tablet Take 1 tablet (500 mg total) by mouth 4 (four) times daily. 120 tablet 2    No results found for this or any previous visit (from the past 48 hour(s)). No results found.  ROS  No recent f/c/n/v/wt loss  Blood pressure (!) 156/71, pulse (!) 16, temperature 98.3 F (36.8 C), temperature source Oral, resp. rate 16, height 5\' 6"  (1.676 m), weight 90.4 kg (199 lb 6 oz), SpO2 100 %. Physical Exam  wn wd woman in nad.  A and O x 4.  Mood and affect normal.  EOMI.  resp unlabored.  L forefoot with mass over medial eminence.  Skin o/w intact.  NO lymphadenopathy.  Moderate bunion.  5/5 strength in PF and DF of the ankle and toes.  Sens  to LT intact at the forefoot.  Assessment/Plan L forefoot mass - to OR for excisional biopsy.  The risks and benefits of the alternative treatment options have been discussed in detail.  The patient wishes to proceed with surgery and specifically understands risks of bleeding, infection, nerve damage, blood clots, need for additional surgery, amputation and death.   Wylene Simmer, MD 11-19-16, 10:37 AM

## 2016-11-09 ENCOUNTER — Encounter (HOSPITAL_BASED_OUTPATIENT_CLINIC_OR_DEPARTMENT_OTHER): Payer: Self-pay | Admitting: Orthopedic Surgery

## 2016-11-25 ENCOUNTER — Telehealth: Payer: Self-pay | Admitting: Rheumatology

## 2016-11-25 NOTE — Telephone Encounter (Signed)
Patient states the new medication she is taking (she did not know the name, but said it was a sulfa drug) is not working. She states she still has a lot of inflammation and has not had any relief. Patient would like to know what her next step is. Please advise.

## 2016-11-26 NOTE — Telephone Encounter (Signed)
Patient advised of recommendation and verbalize understanding.

## 2016-11-26 NOTE — Telephone Encounter (Signed)
Patient states she is having inflammation in her right ankle. Patient states it is not causing her pain in her ankle.  Patient states the last time she was on prednisone it did not take away all of her inflammation. Patient was seen in the office 10/13/16 and had a prednisone taper. Patient states she has been on SSZ since that visit as well. Patient states she is also having pain in her right hand. Patient states when she lays down to go to sleep at night the swelling in her feet goes away and then as soon as she gets up and walks around the swelling returns.

## 2016-11-26 NOTE — Telephone Encounter (Signed)
It appears that she has pedal edema as swelling resolves in the AM. Please ask her to see her PCP.

## 2017-01-14 ENCOUNTER — Ambulatory Visit: Payer: BLUE CROSS/BLUE SHIELD | Admitting: Rheumatology

## 2017-04-30 DIAGNOSIS — IMO0002 Reserved for concepts with insufficient information to code with codable children: Secondary | ICD-10-CM | POA: Insufficient documentation

## 2017-04-30 DIAGNOSIS — R229 Localized swelling, mass and lump, unspecified: Secondary | ICD-10-CM

## 2017-04-30 DIAGNOSIS — M79642 Pain in left hand: Secondary | ICD-10-CM

## 2017-04-30 DIAGNOSIS — M79641 Pain in right hand: Secondary | ICD-10-CM | POA: Insufficient documentation

## 2017-05-03 ENCOUNTER — Other Ambulatory Visit: Payer: Self-pay | Admitting: Orthopedic Surgery

## 2017-06-07 ENCOUNTER — Encounter (HOSPITAL_BASED_OUTPATIENT_CLINIC_OR_DEPARTMENT_OTHER): Payer: Self-pay | Admitting: *Deleted

## 2017-06-15 ENCOUNTER — Ambulatory Visit (HOSPITAL_BASED_OUTPATIENT_CLINIC_OR_DEPARTMENT_OTHER): Payer: BLUE CROSS/BLUE SHIELD | Admitting: Certified Registered"

## 2017-06-15 ENCOUNTER — Encounter (HOSPITAL_BASED_OUTPATIENT_CLINIC_OR_DEPARTMENT_OTHER): Payer: Self-pay | Admitting: Certified Registered"

## 2017-06-15 ENCOUNTER — Ambulatory Visit (HOSPITAL_BASED_OUTPATIENT_CLINIC_OR_DEPARTMENT_OTHER)
Admission: RE | Admit: 2017-06-15 | Discharge: 2017-06-15 | Disposition: A | Discharge status: Home / Self Care | Payer: BLUE CROSS/BLUE SHIELD | Source: Ambulatory Visit | Attending: Orthopedic Surgery | Admitting: Orthopedic Surgery

## 2017-06-15 ENCOUNTER — Encounter (HOSPITAL_BASED_OUTPATIENT_CLINIC_OR_DEPARTMENT_OTHER)
Admission: RE | Disposition: A | Discharge status: Home / Self Care | Payer: Self-pay | Source: Ambulatory Visit | Attending: Orthopedic Surgery

## 2017-06-15 DIAGNOSIS — Z888 Allergy status to other drugs, medicaments and biological substances status: Secondary | ICD-10-CM | POA: Diagnosis not present

## 2017-06-15 DIAGNOSIS — M069 Rheumatoid arthritis, unspecified: Secondary | ICD-10-CM | POA: Insufficient documentation

## 2017-06-15 DIAGNOSIS — F17210 Nicotine dependence, cigarettes, uncomplicated: Secondary | ICD-10-CM | POA: Insufficient documentation

## 2017-06-15 DIAGNOSIS — E669 Obesity, unspecified: Secondary | ICD-10-CM | POA: Diagnosis not present

## 2017-06-15 DIAGNOSIS — R2232 Localized swelling, mass and lump, left upper limb: Secondary | ICD-10-CM | POA: Diagnosis present

## 2017-06-15 DIAGNOSIS — L089 Local infection of the skin and subcutaneous tissue, unspecified: Secondary | ICD-10-CM | POA: Diagnosis not present

## 2017-06-15 HISTORY — PX: EXCISION MASS UPPER EXTREMETIES: SHX6704

## 2017-06-15 SURGERY — EXCISION MASS UPPER EXTREMITIES
Anesthesia: Monitor Anesthesia Care | Site: Finger | Laterality: Left

## 2017-06-15 MED ORDER — LIDOCAINE HCL (CARDIAC) 20 MG/ML IV SOLN
INTRAVENOUS | Status: DC | PRN
  Administered 2017-06-15: 30 mg via INTRAVENOUS

## 2017-06-15 MED ORDER — BUPIVACAINE HCL (PF) 0.25 % IJ SOLN
INTRAMUSCULAR | Status: DC | PRN
  Administered 2017-06-15: 8 mL

## 2017-06-15 MED ORDER — FENTANYL CITRATE (PF) 100 MCG/2ML IJ SOLN
INTRAMUSCULAR | Status: AC
  Filled 2017-06-15: qty 2

## 2017-06-15 MED ORDER — HYDROCODONE-ACETAMINOPHEN 5-325 MG PO TABS: 1.0000 | ORAL_TABLET | Freq: Four times a day (QID) | ORAL | 0 refills | Status: DC | PRN

## 2017-06-15 MED ORDER — LACTATED RINGERS IV SOLN
INTRAVENOUS | Status: DC
  Administered 2017-06-15: 10:00:00 via INTRAVENOUS

## 2017-06-15 MED ORDER — CEFAZOLIN SODIUM-DEXTROSE 2-4 GM/100ML-% IV SOLN
INTRAVENOUS | Status: AC
  Filled 2017-06-15: qty 100

## 2017-06-15 MED ORDER — CHLORHEXIDINE GLUCONATE 4 % EX LIQD: 60.0000 mL | Freq: Once | CUTANEOUS | Status: DC

## 2017-06-15 MED ORDER — PROPOFOL 500 MG/50ML IV EMUL
INTRAVENOUS | Status: DC | PRN
  Administered 2017-06-15: 75 ug/kg/min via INTRAVENOUS

## 2017-06-15 MED ORDER — 0.9 % SODIUM CHLORIDE (POUR BTL) OPTIME
TOPICAL | Status: DC | PRN
  Administered 2017-06-15: 120 mL

## 2017-06-15 MED ORDER — MIDAZOLAM HCL 5 MG/5ML IJ SOLN
INTRAMUSCULAR | Status: DC | PRN
  Administered 2017-06-15: 2 mg via INTRAVENOUS

## 2017-06-15 MED ORDER — SCOPOLAMINE 1 MG/3DAYS TD PT72: 1.0000 | MEDICATED_PATCH | Freq: Once | TRANSDERMAL | Status: DC | PRN

## 2017-06-15 MED ORDER — FENTANYL CITRATE (PF) 100 MCG/2ML IJ SOLN: 50.0000 ug | INTRAMUSCULAR | Status: DC | PRN

## 2017-06-15 MED ORDER — ONDANSETRON HCL 4 MG/2ML IJ SOLN: 4.0000 mg | Freq: Once | INTRAMUSCULAR | Status: DC | PRN

## 2017-06-15 MED ORDER — ONDANSETRON HCL 4 MG/2ML IJ SOLN
INTRAMUSCULAR | Status: DC | PRN
  Administered 2017-06-15: 4 mg via INTRAVENOUS

## 2017-06-15 MED ORDER — CEFAZOLIN SODIUM-DEXTROSE 2-4 GM/100ML-% IV SOLN
2.0000 g | INTRAVENOUS | Status: AC
  Administered 2017-06-15: 2 g via INTRAVENOUS

## 2017-06-15 MED ORDER — MIDAZOLAM HCL 2 MG/2ML IJ SOLN: 1.0000 mg | INTRAMUSCULAR | Status: DC | PRN

## 2017-06-15 MED ORDER — LIDOCAINE HCL (PF) 0.5 % IJ SOLN
INTRAMUSCULAR | Status: DC | PRN
  Administered 2017-06-15: 30 mL via INTRAVENOUS

## 2017-06-15 MED ORDER — FENTANYL CITRATE (PF) 100 MCG/2ML IJ SOLN: 25.0000 ug | INTRAMUSCULAR | Status: DC | PRN

## 2017-06-15 MED ORDER — MIDAZOLAM HCL 2 MG/2ML IJ SOLN
INTRAMUSCULAR | Status: AC
  Filled 2017-06-15: qty 2

## 2017-06-15 MED ORDER — FENTANYL CITRATE (PF) 100 MCG/2ML IJ SOLN
INTRAMUSCULAR | Status: DC | PRN
  Administered 2017-06-15: 50 ug via INTRAVENOUS

## 2017-06-15 MED ORDER — BUPIVACAINE HCL (PF) 0.25 % IJ SOLN
INTRAMUSCULAR | Status: AC
  Filled 2017-06-15: qty 30

## 2017-06-15 MED ORDER — LACTATED RINGERS IV SOLN: 500.0000 mL | INTRAVENOUS | Status: DC

## 2017-06-15 SURGICAL SUPPLY — 49 items
BANDAGE COBAN STERILE 2 (GAUZE/BANDAGES/DRESSINGS) IMPLANT
BLADE MINI RND TIP GREEN BEAV (BLADE) IMPLANT
BLADE SURG 15 STRL LF DISP TIS (BLADE) ×2 IMPLANT
BLADE SURG 15 STRL SS (BLADE) ×1
BNDG COHESIVE 1X5 TAN STRL LF (GAUZE/BANDAGES/DRESSINGS) ×3 IMPLANT
BNDG COHESIVE 3X5 TAN STRL LF (GAUZE/BANDAGES/DRESSINGS) IMPLANT
BNDG ESMARK 4X9 LF (GAUZE/BANDAGES/DRESSINGS) ×3 IMPLANT
BNDG GAUZE ELAST 4 BULKY (GAUZE/BANDAGES/DRESSINGS) IMPLANT
CHLORAPREP W/TINT 26ML (MISCELLANEOUS) ×3 IMPLANT
CORD BIPOLAR FORCEPS 12FT (ELECTRODE) ×3 IMPLANT
COVER BACK TABLE 60X90IN (DRAPES) ×3 IMPLANT
COVER MAYO STAND STRL (DRAPES) ×3 IMPLANT
CUFF TOURNIQUET SINGLE 18IN (TOURNIQUET CUFF) ×3 IMPLANT
DECANTER SPIKE VIAL GLASS SM (MISCELLANEOUS) IMPLANT
DRAIN PENROSE 1/2X12 LTX STRL (WOUND CARE) IMPLANT
DRAPE EXTREMITY T 121X128X90 (DRAPE) ×3 IMPLANT
DRAPE SURG 17X23 STRL (DRAPES) ×3 IMPLANT
GAUZE SPONGE 4X4 12PLY STRL (GAUZE/BANDAGES/DRESSINGS) ×3 IMPLANT
GAUZE XEROFORM 1X8 LF (GAUZE/BANDAGES/DRESSINGS) ×3 IMPLANT
GLOVE BIO SURGEON STRL SZ 6.5 (GLOVE) ×3 IMPLANT
GLOVE BIOGEL PI IND STRL 7.0 (GLOVE) ×2 IMPLANT
GLOVE BIOGEL PI IND STRL 8.5 (GLOVE) ×2 IMPLANT
GLOVE BIOGEL PI INDICATOR 7.0 (GLOVE) ×1
GLOVE BIOGEL PI INDICATOR 8.5 (GLOVE) ×1
GLOVE SURG ORTHO 8.0 STRL STRW (GLOVE) ×3 IMPLANT
GOWN STRL REUS W/ TWL LRG LVL3 (GOWN DISPOSABLE) ×2 IMPLANT
GOWN STRL REUS W/TWL LRG LVL3 (GOWN DISPOSABLE) ×1
GOWN STRL REUS W/TWL XL LVL3 (GOWN DISPOSABLE) ×3 IMPLANT
NDL SAFETY ECLIPSE 18X1.5 (NEEDLE) ×2 IMPLANT
NEEDLE HYPO 18GX1.5 SHARP (NEEDLE) ×1
NEEDLE PRECISIONGLIDE 27X1.5 (NEEDLE) ×3 IMPLANT
NS IRRIG 1000ML POUR BTL (IV SOLUTION) ×3 IMPLANT
PACK BASIN DAY SURGERY FS (CUSTOM PROCEDURE TRAY) ×3 IMPLANT
PAD CAST 3X4 CTTN HI CHSV (CAST SUPPLIES) IMPLANT
PADDING CAST ABS 3INX4YD NS (CAST SUPPLIES)
PADDING CAST ABS 4INX4YD NS (CAST SUPPLIES) ×1
PADDING CAST ABS COTTON 3X4 (CAST SUPPLIES) IMPLANT
PADDING CAST ABS COTTON 4X4 ST (CAST SUPPLIES) ×2 IMPLANT
PADDING CAST COTTON 3X4 STRL (CAST SUPPLIES)
SPLINT FINGER 3.25 BULB 911905 (SOFTGOODS) ×3 IMPLANT
SPLINT PLASTER CAST XFAST 3X15 (CAST SUPPLIES) IMPLANT
SPLINT PLASTER XTRA FASTSET 3X (CAST SUPPLIES)
STOCKINETTE 4X48 STRL (DRAPES) ×3 IMPLANT
SUT ETHILON 4 0 PS 2 18 (SUTURE) ×3 IMPLANT
SUT VIC AB 4-0 P2 18 (SUTURE) IMPLANT
SYR BULB 3OZ (MISCELLANEOUS) ×3 IMPLANT
SYR CONTROL 10ML LL (SYRINGE) ×3 IMPLANT
TOWEL OR 17X24 6PK STRL BLUE (TOWEL DISPOSABLE) ×6 IMPLANT
UNDERPAD 30X30 (UNDERPADS AND DIAPERS) IMPLANT

## 2017-06-15 NOTE — Brief Op Note (Signed)
06/15/2017  10:32 AM  PATIENT:  Jamie Mccall  61 y.o. female  PRE-OPERATIVE DIAGNOSIS:  MASS LEFT INDEX  POST-OPERATIVE DIAGNOSIS:  MASS LEFT INDEX  PROCEDURE:  Procedure(s): EXCISION MASS LEFT INDEX FINGER (Left)  SURGEON:  Surgeon(s) and Role:    * Daryll Brod, MD - Primary  PHYSICIAN ASSISTANT:   ASSISTANTS: none   ANESTHESIA:   local, regional and IV sedation  EBL:  none  BLOOD ADMINISTERED:none  DRAINS: none   LOCAL MEDICATIONS USED:  BUPIVICAINE   SPECIMEN:  Excision  DISPOSITION OF SPECIMEN:  PATHOLOGY  COUNTS:  YES  TOURNIQUET:   Total Tourniquet Time Documented: Forearm (Left) - 27 minutes Total: Forearm (Left) - 27 minutes   DICTATION: .Other Dictation: Dictation Number [id number]  PLAN OF CARE: Discharge to home after PACU  PATIENT DISPOSITION:  PACU - hemodynamically stable.

## 2017-06-15 NOTE — Op Note (Signed)
Dictation Number [id number]

## 2017-06-15 NOTE — H&P (Signed)
Jamie Mccall is an 61 y.o. female.   Chief Complaint: mass left index finger WUJ:[id number] is a 61 year old female with rheumatoid arthritis treated by Dr. Estanislado Pandy. She has stopped taking all of her rheumatoid medicines taking ibuprofen. She is complaining of a mass on her left index finger middle phalanx radial aspect dorsally. Has a sharp pain whenever she hits it. This been present for years gradually enlarging. She is not having pain otherwise. She is also complaining of her right index finger with a catching motion to flexion extension she had a tenosynovectomy done in the past by myself on that finger. He has no history of injury. She is not complaining significant pain on either side. She is complaining more of loss of function on her index finger right hand. Family history is positive for high blood pressure and arthritis      Past Medical History:  Diagnosis Date  . Arthritis    RA    Past Surgical History:  Procedure Laterality Date  . EYE SURGERY     rt corneal transplants  . HAND SURGERY Right     History reviewed. No pertinent family history. Social History:  reports that she has been smoking.  She has a 15.00 pack-year smoking history. she has never used smokeless tobacco. She reports that she drinks about 0.6 oz of alcohol per week. She reports that she does not use drugs.  Allergies:  Allergies  Allergen Reactions  . Enbrel [Etanercept] Other (See Comments)    "I got pneumonia"    No medications prior to admission.    No results found for this or any previous visit (from the past 48 hour(s)).  No results found.   Pertinent items are noted in HPI.  Height 5\' 5"  (1.651 m), weight 88.5 kg (195 lb).  General appearance: alert, cooperative and appears stated age Head: Normocephalic, without obvious abnormality Neck: no JVD Resp: clear to auscultation bilaterally Cardio: regular rate and rhythm, S1, S2 normal, no murmur, click, rub or gallop GI: soft,  non-tender; bowel sounds normal; no masses,  no organomegaly Extremities:  mass left index finger Pulses: 2+ and symmetric Skin: Skin color, texture, turgor normal. No rashes or lesions Neurologic: Grossly normal Incision/Wound: na  Assessment/Plan Assessment:  Mass left index finger  Plan: She would like to have the mass removed from her index finger left hand. Pre-peri-and postoperative course are discussed along with risk applications. She is where there is no guarantee to the surgery the possibility of infection recurrence injury to arteries nerves tendons complete relief symptoms dystrophy. She would also like to discuss taking care of the right index finger she is vice going to require reconstruction of the collateral ligament with centralization of the extensor tendon which is much larger operation the left side. We recommend proceeding with a left side first and then dealing with the right side after that healed. She is scheduled for excision mass left index finger as an outpatient under regional anesthesia.      Khai Arrona R 06/15/2017, 8:36 AM

## 2017-06-15 NOTE — Anesthesia Procedure Notes (Signed)
Procedure Name: MAC Date/Time: 06/15/2017 10:00 AM Performed by: Signe Colt, CRNA Pre-anesthesia Checklist: Patient identified, Emergency Drugs available, Suction available, Patient being monitored and Timeout performed Patient Re-evaluated:Patient Re-evaluated prior to induction Oxygen Delivery Method: Simple face mask Preoxygenation: Pre-oxygenation with 100% oxygen

## 2017-06-15 NOTE — Discharge Instructions (Addendum)

## 2017-06-15 NOTE — Anesthesia Postprocedure Evaluation (Signed)
Anesthesia Post Note  Patient: Jamie Mccall  Procedure(s) Performed: EXCISION MASS LEFT INDEX FINGER (Left Finger)     Patient location during evaluation: PACU Anesthesia Type: Bier Block Level of consciousness: awake and alert Pain management: pain level controlled Vital Signs Assessment: post-procedure vital signs reviewed and stable Respiratory status: spontaneous breathing, nonlabored ventilation, respiratory function stable and patient connected to nasal cannula oxygen Cardiovascular status: stable and blood pressure returned to baseline Postop Assessment: no apparent nausea or vomiting Anesthetic complications: no    Last Vitals:  Vitals:   06/15/17 1045 06/15/17 1115  BP: 119/66 120/72  Pulse: 75 70  Resp: 20 18  Temp:  36.6 C  SpO2: 96% 100%    Last Pain:  Vitals:   06/15/17 1045  PainSc: 0-No pain                 Ryan P Ellender

## 2017-06-15 NOTE — Anesthesia Preprocedure Evaluation (Addendum)
Anesthesia Evaluation  Patient identified by MRN, date of birth, ID band Patient awake    Reviewed: Allergy & Precautions, NPO status , Patient's Chart, lab work & pertinent test results  Airway Mallampati: II  TM Distance: >3 FB Neck ROM: Full    Dental  (+) Teeth Intact, Dental Advisory Given   Pulmonary Current Smoker,    Pulmonary exam normal breath sounds clear to auscultation       Cardiovascular negative cardio ROS Normal cardiovascular exam Rhythm:Regular Rate:Normal     Neuro/Psych negative neurological ROS     GI/Hepatic negative GI ROS, Neg liver ROS,   Endo/Other  Obesity   Renal/GU negative Renal ROS     Musculoskeletal  (+) Arthritis , Rheumatoid disorders,    Abdominal (+) + obese,   Peds  Hematology negative hematology ROS (+)   Anesthesia Other Findings Day of surgery medications reviewed with the patient.  Reproductive/Obstetrics                             Anesthesia Physical  Anesthesia Plan  ASA: II  Anesthesia Plan: Bier Block and MAC and IV Regional(Bier Block-LIDOCAINE ONLY)   Post-op Pain Management:    Induction: Intravenous  PONV Risk Score and Plan: 1 and Propofol infusion and Ondansetron  Airway Management Planned: Natural Airway  Additional Equipment:   Intra-op Plan:   Post-operative Plan:   Informed Consent: I have reviewed the patients History and Physical, chart, labs and discussed the procedure including the risks, benefits and alternatives for the proposed anesthesia with the patient or authorized representative who has indicated his/her understanding and acceptance.   Dental advisory given  Plan Discussed with: CRNA  Anesthesia Plan Comments:         Anesthesia Quick Evaluation                                   Anesthesia Evaluation  Patient identified by MRN, date of birth, ID band Patient awake    Reviewed: Allergy &  Precautions, NPO status , Patient's Chart, lab work & pertinent test results  Airway Mallampati: II  TM Distance: >3 FB Neck ROM: Full    Dental  (+) Teeth Intact, Dental Advisory Given   Pulmonary Current Smoker,    Pulmonary exam normal breath sounds clear to auscultation       Cardiovascular negative cardio ROS Normal cardiovascular exam Rhythm:Regular Rate:Normal     Neuro/Psych negative neurological ROS     GI/Hepatic negative GI ROS, Neg liver ROS,   Endo/Other  Obesity   Renal/GU negative Renal ROS     Musculoskeletal  (+) Arthritis , Rheumatoid disorders,    Abdominal   Peds  Hematology negative hematology ROS (+)   Anesthesia Other Findings Day of surgery medications reviewed with the patient.  Reproductive/Obstetrics                             Anesthesia Physical Anesthesia Plan  ASA: II  Anesthesia Plan: General   Post-op Pain Management:    Induction: Intravenous  Airway Management Planned: LMA  Additional Equipment:   Intra-op Plan:   Post-operative Plan: Extubation in OR  Informed Consent: I have reviewed the patients History and Physical, chart, labs and discussed the procedure including the risks, benefits and alternatives for the proposed anesthesia with the patient or  authorized representative who has indicated his/her understanding and acceptance.   Dental advisory given  Plan Discussed with: CRNA and Anesthesiologist  Anesthesia Plan Comments: (Risks/benefits of general anesthesia discussed with patient including risk of damage to teeth, lips, gum, and tongue, nausea/vomiting, allergic reactions to medications, and the possibility of heart attack, stroke and death.  All patient questions answered.  Patient wishes to proceed.)       Anesthesia Quick Evaluation

## 2017-06-15 NOTE — Transfer of Care (Signed)
Immediate Anesthesia Transfer of Care Note  Patient: Sharnese Enrique  Procedure(s) Performed: EXCISION MASS LEFT INDEX FINGER (Left Finger)  Patient Location: PACU  Anesthesia Type:Bier block  Level of Consciousness: awake, alert , oriented and patient cooperative  Airway & Oxygen Therapy: Patient Spontanous Breathing and Patient connected to face mask oxygen  Post-op Assessment: Report given to RN and Post -op Vital signs reviewed and stable  Post vital signs: Reviewed and stable  Last Vitals:  Vitals:   06/15/17 0906  BP: (!) 162/72  Pulse: 82  Resp: 18  Temp: (!) 36.4 C  SpO2: 100%    Last Pain:  Vitals:   06/15/17 0950  PainSc: 0-No pain         Complications: No apparent anesthesia complications

## 2017-06-15 NOTE — Op Note (Signed)
NAMEMarland Kitchen  SARINITY, DICICCO NO.:  [id number]  MEDICAL RECORD NO.:  [id number]  LOCATION:                                 FACILITY:  PHYSICIAN:  Daryll Brod, M.D.            DATE OF BIRTH:  DATE OF PROCEDURE:  06/15/2017 DATE OF DISCHARGE:                              OPERATIVE REPORT   PREOPERATIVE DIAGNOSIS:  Mass, left index finger.  POSTOPERATIVE DIAGNOSIS:  Mass, left index finger.  OPERATION:  Excisional biopsy, mass, left index finger.  SURGEON:  Daryll Brod, MD.  ANESTHESIA:  Forearm-based IV regional with local infiltration, metacarpal block and IV sedation.  PLACE OF SURGERY:  [facility].  HISTORY:  The patient is a 61 year old female with a history of rheumatoid arthritis, who developed a mass on the dorsal aspect of the middle phalanx of her right index finger.  This has significantly enlarged over the past several months.  She is desirous of having it surgically removed.  Pre, peri, and postoperative courses have been discussed along with risks and complications.  She is aware there is no guarantee to the surgery, the possibility of infection, recurrence of injury to arteries, nerves, and tendons, incomplete relief of symptoms, and dystrophy.  In the preoperative area, the patient is seen, the extremity marked by both patient and surgeon, antibiotic given.  DESCRIPTION OF PROCEDURE:  The patient was brought to the operating room where a forearm-based IV regional anesthetic was carried out without difficulty under the direction of the Anesthesia Department.  She was prepped using ChloraPrep in a supine position with the left arm free.  A 3-minute dry time was allowed and time-out was taken confirming the patient and procedure.  An oblique incision was made over the mass, carried down through subcutaneous tissue.  This was on the left index finger.  Metacarpal block was given with 0.25% bupivacaine without epinephrine prior to the  incision.  A multilobulated yellow brown mass was immediately encountered.  This was not well circumscribed.  With blunt and sharp dissection, it was dissected free.  This was followed down to the volar aspect protecting the radial artery and nerve, which was on its palmar border.  It was all the way down to the periosteum of the bone.  The extensor tendon had been pushed aside by the mass.  This was excised in toto.  It measured approximately 1.7 cm in diameter.  The specimen was sent to Pathology.  Due to the size of the mass, skin was excised allowing removal of dead area.  The wound was copiously irrigated with saline.  The skin was then closed with interrupted 4-0 nylon sutures.  A sterile compressive dressing and splint to the finger were applied.  On deflation of the tourniquet, remaining fingers pinked.  She was taken to the recovery room for observation in satisfactory condition.  She will be discharged to home to return to [place] in 1 week, on Norco.          ______________________________ Daryll Brod, M.D.     GK/MEDQ  D:  06/15/2017  T:  06/15/2017  Job:  [id number]

## 2017-06-15 NOTE — Anesthesia Procedure Notes (Signed)
Anesthesia Regional Block: Bier block (IV Regional)   Pre-Anesthetic Checklist: ,, timeout performed, Correct Patient, Correct Site, Correct Laterality, Correct Procedure,, site marked, surgical consent,, at surgeon's request  Laterality: Left     Needles:  Injection technique: Single-shot  Needle Type: Other      Needle Gauge: 20     Additional Needles:   Procedures:,,,,, intact distal pulses, Esmarch exsanguination, single tourniquet utilized,  Narrative:   Performed by: Personally       

## 2017-06-16 ENCOUNTER — Encounter (HOSPITAL_BASED_OUTPATIENT_CLINIC_OR_DEPARTMENT_OTHER): Payer: Self-pay | Admitting: Orthopedic Surgery

## 2017-07-22 ENCOUNTER — Telehealth: Payer: Self-pay

## 2017-07-22 NOTE — Telephone Encounter (Signed)
Patient would like a Rx.  Stated she is having a flare in her hands.  Cb# is [phone].  Please advise.  Thank you

## 2017-07-23 MED ORDER — PREDNISONE 5 MG PO TABS: ORAL_TABLET | ORAL | 0 refills | Status: DC

## 2017-07-23 NOTE — Telephone Encounter (Signed)
Okay to give her prednisone taper as she did in March 2018. His discussed side effects of prednisone including increasing blood pressure and blood sugar. She should schedule a follow-up appointment.

## 2017-07-23 NOTE — Telephone Encounter (Signed)
Left message to advise patient prescription for prednisone sent to the pharmacy. Patient has an appointment for 12/22/2017. Patient placed on the cancellation list for earlier appointment.

## 2017-07-23 NOTE — Addendum Note (Signed)
Addended by: Carole Binning on: 07/23/2017 10:56 AM   Modules accepted: Orders

## 2017-07-23 NOTE — Telephone Encounter (Signed)
Patient states she is not currently on any medications. 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 Patient states she is having pain in bilateral hands only. Patient states she is having no swelling and ibuprofen dulls the pain. Patient is requesting a prescription for Prednisone. Please advise

## 2017-08-10 HISTORY — PX: HAND SURGERY: SHX662

## 2017-12-09 NOTE — Progress Notes (Addendum)
Office Visit Note  Patient: Jamie Mccall             Date of Birth: Sep 08, 1955           MRN: [id number]             PCP: Aletha Halim., PA-C Referring: Aletha Halim., PA-C Visit Date: 12/22/2017 Occupation: @GUAROCC @    Subjective:  Hand swelling    History of Present Illness: Jamie Mccall is a 62 y.o. female with history of rheumatoid arthritis and osteoarthritis.  Patient states that she was having a rheumatoid arthritis flare starting in December.  She states that she had increased joint pain and joint swelling in multiple joints until the end of March.  She states that she started using CBD oil April 1 and has no pain in her joints at this time.  She says she is also been taking ibuprofen in the morning as well as in the evening.  She states that she continues to have a rheumatoid nodule in her right second MCP joint.  She states she has been seeing Dr. Fredna Dow who was removed rheumatoid nodule in the past.  She states that she continues to follow-up with her ophthalmologist on a regular basis.   Activities of Daily Living:  Patient reports morning stiffness for 0 minutes.   Patient Denies nocturnal pain.  Difficulty dressing/grooming: Denies Difficulty climbing stairs: Denies Difficulty getting out of chair: Denies Difficulty using hands for taps, buttons, cutlery, and/or writing: Reports   Review of Systems  Constitutional: Negative for fatigue.  HENT: Negative for mouth sores, mouth dryness and nose dryness.   Eyes: Negative for pain, visual disturbance and dryness.  Respiratory: Negative for cough, hemoptysis, shortness of breath and difficulty breathing.   Cardiovascular: Negative for chest pain, palpitations, hypertension and swelling in legs/feet.  Gastrointestinal: Positive for diarrhea. Negative for blood in stool and constipation.  Endocrine: Negative for increased urination.  Genitourinary: Negative for painful urination.  Musculoskeletal:  Positive for joint swelling. Negative for arthralgias, joint pain, myalgias, muscle weakness, morning stiffness, muscle tenderness and myalgias.  Skin: Positive for nodules/bumps. Negative for color change, pallor, rash, hair loss, skin tightness, ulcers and sensitivity to sunlight.  Allergic/Immunologic: Negative for susceptible to infections.  Neurological: Negative for dizziness, numbness, headaches and weakness.  Hematological: Negative for swollen glands.  Psychiatric/Behavioral: Negative for depressed mood and sleep disturbance. The patient is not nervous/anxious.     PMFS History:  Patient Active Problem List   Diagnosis Date Noted  . 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[facility]) 10/13/2016  . History of corneal transplant 10/13/2016  . High risk medication use 10/13/2016  . Degenerative joint disease involving multiple joints 10/13/2016  . History of hypertension 10/13/2016  . History of depression 10/13/2016  . Morning joint stiffness 10/13/2016    Past Medical History:  Diagnosis Date  . Arthritis    RA    History reviewed. No pertinent family history. Past Surgical History:  Procedure Laterality Date  . EXCISION MASS UPPER EXTREMETIES Left 06/15/2017   Procedure: EXCISION MASS LEFT INDEX FINGER;  Surgeon: Daryll Brod, MD;  Location: [facility];  Service: Orthopedics;  Laterality: Left;  . EYE SURGERY     rt corneal transplants  . FOOT SURGERY Left 2018   cyst removed   . HAND SURGERY Right   . HAND SURGERY Left 2019   Dr. Fredna Dow   . MASS EXCISION Left 11/05/2016   Procedure: Excision of left foot mass;  Surgeon:  Wylene Simmer, MD;  Location: [facility];  Service: Orthopedics;  Laterality: Left;   Social History   Social History Narrative  . Not on file     Objective: Vital Signs: BP 124/84 (BP Location: Left Arm, Patient Position: Sitting, Cuff Size: Normal)   Pulse 76   Resp 17   Ht 5\' 5"  (1.651 m)   Wt 198 lb (89.8 kg)    BMI 32.95 kg/m    Physical Exam  Constitutional: She is oriented to person, place, and time. She appears well-developed and well-nourished.  HENT:  Head: Normocephalic and atraumatic.  Eyes: Conjunctivae and EOM are normal.  Neck: Normal range of motion.  Cardiovascular: Normal rate, regular rhythm, normal heart sounds and intact distal pulses.  Pulmonary/Chest: Effort normal and breath sounds normal.  Abdominal: Soft. Bowel sounds are normal.  Lymphadenopathy:    She has no cervical adenopathy.  Neurological: She is alert and oriented to person, place, and time.  Skin: Skin is warm and dry. Capillary refill takes less than 2 seconds.  Psychiatric: She has a normal mood and affect. Her behavior is normal.  Nursing note and vitals reviewed.    Musculoskeletal Exam: C-spine, thoracic, and lumbar spine good ROM.  Shoulder joints, elbow joints, wrist joints, MCPs, PIPs, and DIPs good ROM.  Right 2nd MCP synovitis. Synovial cyst on right 2nd MCP. ulnar deviation bilaterally.  Hip joints, knee joints, ankle joints, MTPs, PIPs, and DIPs good ROM with no synovitis.  Left knee slightly warm.  No effusion.  No tenderness of trochanteric bursa.    CDAI Exam: CDAI Homunculus Exam:   Tenderness:  Right hand: 2nd MCP Left hand: 2nd MCP  Swelling:  Right hand: 2nd MCP Left hand: 2nd MCP LLE: tibiofemoral  Joint Counts:  CDAI Tender Joint count: 2 CDAI Swollen Joint count: 3  Global Assessments:  Patient Global Assessment: 2   CDAI Calculated Score: 7    Investigation: No additional findings.   Imaging: Xr Foot 2 Views Left  Result Date: 12/22/2017 Juxta-articular osteopenia was noted.  First MTP narrowing and subluxation was noted.  PIP and DIP narrowing was noted.  No intertarsal joint space narrowing was noted.  His calcaneal spur was noted.  Tibiotalar joint space narrowing was noted. Impression: These findings are consistent with rheumatoid arthritis and osteoarthritis  overlap.  Xr Foot 2 Views Right  Result Date: 12/22/2017 First MTP is severe narrowing and subluxation was noted.  All other MTPs appears normal except for juxta-articular osteopenia without any erosive changes.  PIP and DIP narrowing was noted.  No intertarsal joint space narrowing was noted.  Tibiotalar joint space narrowing was noted.  Calcaneal spur was noted. Impression: These findings are consistent with rheumatoid arthritis and osteoarthritis overlap.  Xr Hand 2 View Left  Result Date: 12/22/2017 Juxta-articular osteopenia was noted.  Third MCP narrowing was noted.  PIP narrowing was noted.  No significant intercarpal or radiocarpal joint space narrowing was noted.  Some cystic changes in carpal bones were noted. Impression: These findings are consistent with rheumatoid arthritis and osteoarthritis overlap.  Xr Hand 2 View Right  Result Date: 12/22/2017 Second MCP severe narrowing with a small erosion was noted.  Third MCP narrowing was noted.  Juxta-articular osteopenia was noted.  PIP narrowing intercarpal and radiocarpal joint space narrowing was noted. Impression: These findings are consistent with rheumatoid arthritis and osteoarthritis overlap.   Speciality Comments: 06/27/14 DR. 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  She has warmth of her left knee on exam.  She had rheumatoid arthritis flare in December.  She has not been on any medications for her rheumatoid arthritis in several years.  She recently started using CBD oil and has no joint pain at this time.  She reports that the CBD oil has helped with her joint pain  joint swelling and joint stiffness.  We did discuss starting her on methotrexate 4 tablets once weekly and folic acid 1 mg daily.  Indications, contraindications, and side effects were discussed.  All questions were addressed.  She would like to stay on low-dose methotrexate.  We stressed the importance of avoiding alcohol.  We will check her labs in 2 weeks then 2 months and every 3 months following.  X-rays of hands and feet were obtained today to track progression of rheumatoid arthritis.- Plan: XR Foot 2 Views Right, XR Foot 2 Views Left, XR Hand 2 View Left, XR Hand 2 View Right  High risk medication use -we will start her on methotrexate 4 tablets once weekly.  CBC, CMP, TB gold, SPEP, and immunoglobulins, and hepatitis panel were checked today.  Plan: CBC with Differential/Platelet, COMPLETE METABOLIC PANEL WITH GFR, HIV antibody, QuantiFERON-TB Gold Plus, Serum protein electrophoresis with reflex, IgG, IgA, IgM, Hepatitis B core antibody, IgM, Hepatitis B surface antigen, Hepatitis C antibody   Primary osteoarthritis of both hands: PIP and DIP synovial thickening consistent with osteoarthritis of bilateral hands.  Joint reduction muscle strengthening were discussed.  History of corneal transplant: She follows up with her ophthalmologist on a regular basis.  History of hypertension  History of depression     Orders: Orders Placed This Encounter  Procedures  . XR Foot 2 Views Right  . XR Foot 2 Views Left  . XR Hand 2 View Left  . XR Hand 2 View Right  . CBC with Differential/Platelet  . COMPLETE METABOLIC PANEL WITH GFR  . HIV antibody  . QuantiFERON-TB Gold Plus  . Serum protein electrophoresis with reflex  . IgG, IgA, IgM  . Hepatitis B core antibody, IgM  . Hepatitis B surface antigen  . Hepatitis C antibody   No orders of the defined types were placed in this encounter.   Face-to-face time spent with patient was 40 minutes. >50% of time was spent in counseling and  coordination of care.  Follow-Up Instructions: Return in about 5 months (around 05/24/2018) for Rheumatoid arthritis, Osteoarthritis.   Ofilia Neas, PA-C I examined and evaluated the patient with Hazel Sams PA.  Patient reports having a flare few months back.  She did have some synovitis and synovial thickening on examination.  We had detailed discussion regarding different treatment options.  Patient opted to go on methotrexate.  Side effects were discussed at length.  She wants to stay on low-dose methotrexate.  We will continue to monitor her lab work.  The plan of care was discussed as noted above.  Bo Merino, MD Note - This record has been created using Editor, commissioning.  Chart creation errors have been sought, but may not always  have been located. Such creation errors do not reflect on  the standard of medical  care.

## 2017-12-22 ENCOUNTER — Ambulatory Visit (INDEPENDENT_AMBULATORY_CARE_PROVIDER_SITE_OTHER): Payer: Self-pay

## 2017-12-22 ENCOUNTER — Encounter: Payer: Self-pay | Admitting: Rheumatology

## 2017-12-22 ENCOUNTER — Ambulatory Visit (INDEPENDENT_AMBULATORY_CARE_PROVIDER_SITE_OTHER): Payer: BLUE CROSS/BLUE SHIELD | Admitting: Rheumatology

## 2017-12-22 VITALS — BP 124/84 | HR 76 | Resp 17 | Ht 65.0 in | Wt 198.0 lb

## 2017-12-22 DIAGNOSIS — Z8659 Personal history of other mental and behavioral disorders: Secondary | ICD-10-CM

## 2017-12-22 DIAGNOSIS — Z79899 Other long term (current) drug therapy: Secondary | ICD-10-CM | POA: Diagnosis not present

## 2017-12-22 DIAGNOSIS — M19041 Primary osteoarthritis, right hand: Secondary | ICD-10-CM | POA: Diagnosis not present

## 2017-12-22 DIAGNOSIS — Z8679 Personal history of other diseases of the circulatory system: Secondary | ICD-10-CM

## 2017-12-22 DIAGNOSIS — M19042 Primary osteoarthritis, left hand: Secondary | ICD-10-CM | POA: Diagnosis not present

## 2017-12-22 DIAGNOSIS — M0609 Rheumatoid arthritis without rheumatoid factor, multiple sites: Secondary | ICD-10-CM | POA: Diagnosis not present

## 2017-12-22 DIAGNOSIS — Z947 Corneal transplant status: Secondary | ICD-10-CM | POA: Diagnosis not present

## 2017-12-22 DIAGNOSIS — M0579 Rheumatoid arthritis with rheumatoid factor of multiple sites without organ or systems involvement: Secondary | ICD-10-CM

## 2017-12-22 NOTE — Patient Instructions (Addendum)
Methotrexate tablets What is this medicine? METHOTREXATE (METH oh TREX ate) is a chemotherapy drug used to treat cancer including breast cancer, leukemia, and lymphoma. This medicine can also be used to treat psoriasis and certain kinds of arthritis. This medicine may be used for other purposes; ask your health care provider or pharmacist if you have questions. COMMON BRAND NAME(S): Rheumatrex, Trexall What should I tell my health care provider before I take this medicine? They need to know if you have any of these conditions: -fluid in the stomach area or lungs -if you often drink alcohol -infection or immune system problems -kidney disease or on hemodialysis -liver disease -low blood counts, like low white cell, platelet, or red cell counts -lung disease -radiation therapy -stomach ulcers -ulcerative colitis -an unusual or allergic reaction to methotrexate, other medicines, foods, dyes, or preservatives -pregnant or trying to get pregnant -breast-feeding How should I use this medicine? Take this medicine by mouth with a glass of water. Follow the directions on the prescription label. Take your medicine at regular intervals. Do not take it more often than directed. Do not stop taking except on your doctor's advice. Make sure you know why you are taking this medicine and how often you should take it. If this medicine is used for a condition that is not cancer, like arthritis or psoriasis, it should be taken weekly, NOT daily. Taking this medicine more often than directed can cause serious side effects, even death. Talk to your healthcare provider about safe handling and disposal of this medicine. You may need to take special precautions. Talk to your pediatrician regarding the use of this medicine in children. While this drug may be prescribed for selected conditions, precautions do apply. Overdosage: If you think you have taken too much of this medicine contact a poison control center or  emergency room at once. NOTE: This medicine is only for you. Do not share this medicine with others. What if I miss a dose? If you miss a dose, talk with your doctor or health care professional. Do not take double or extra doses. What may interact with this medicine? This medicine may interact with the following medication: -acitretin -aspirin and aspirin-like medicines including salicylates -azathioprine -certain antibiotics like penicillins, tetracycline, and chloramphenicol -cyclosporine -gold -hydroxychloroquine -live virus vaccines -NSAIDs, medicines for pain and inflammation, like ibuprofen or naproxen -other cytotoxic agents -penicillamine -phenylbutazone -phenytoin -probenecid -retinoids such as isotretinoin and tretinoin -steroid medicines like prednisone or cortisone -sulfonamides like sulfasalazine and trimethoprim/sulfamethoxazole -theophylline This list may not describe all possible interactions. Give your health care provider a list of all the medicines, herbs, non-prescription drugs, or dietary supplements you use. Also tell them if you smoke, drink alcohol, or use illegal drugs. Some items may interact with your medicine. What should I watch for while using this medicine? Avoid alcoholic drinks. This medicine can make you more sensitive to the sun. Keep out of the sun. If you cannot avoid being in the sun, wear protective clothing and use sunscreen. Do not use sun lamps or tanning beds/booths. You may need blood work done while you are taking this medicine. Call your doctor or health care professional for advice if you get a fever, chills or sore throat, or other symptoms of a cold or flu. Do not treat yourself. This drug decreases your body's ability to fight infections. Try to avoid being around people who are sick. This medicine may increase your risk to bruise or bleed. Call your doctor or health care professional   if you notice any unusual bleeding. Check with your  doctor or health care professional if you get an attack of severe diarrhea, nausea and vomiting, or if you sweat a lot. The loss of too much body fluid can make it dangerous for you to take this medicine. Talk to your doctor about your risk of cancer. You may be more at risk for certain types of cancers if you take this medicine. Both men and women must use effective birth control with this medicine. Do not become pregnant while taking this medicine or until at least 1 normal menstrual cycle has occurred after stopping it. Women should inform their doctor if they wish to become pregnant or think they might be pregnant. Men should not father a child while taking this medicine and for 3 months after stopping it. There is a potential for serious side effects to an unborn child. Talk to your health care professional or pharmacist for more information. Do not breast-feed an infant while taking this medicine. What side effects may I notice from receiving this medicine? Side effects that you should report to your doctor or health care professional as soon as possible: -allergic reactions like skin rash, itching or hives, swelling of the face, lips, or tongue -breathing problems or shortness of breath -diarrhea -dry, nonproductive cough -low blood counts - this medicine may decrease the number of white blood cells, red blood cells and platelets. You may be at increased risk for infections and bleeding. -mouth sores -redness, blistering, peeling or loosening of the skin, including inside the mouth -signs of infection - fever or chills, cough, sore throat, pain or trouble passing urine -signs and symptoms of bleeding such as bloody or black, tarry stools; red or dark-brown urine; spitting up blood or brown material that looks like coffee grounds; red spots on the skin; unusual bruising or bleeding from the eye, gums, or nose -signs and symptoms of kidney injury like trouble passing urine or change in the amount  of urine -signs and symptoms of liver injury like dark yellow or brown urine; general ill feeling or flu-like symptoms; light-colored stools; loss of appetite; nausea; right upper belly pain; unusually weak or tired; yellowing of the eyes or skin Side effects that usually do not require medical attention (report to your doctor or health care professional if they continue or are bothersome): -dizziness -hair loss -tiredness -upset stomach -vomiting This list may not describe all possible side effects. Call your doctor for medical advice about side effects. You may report side effects to FDA at 1-800-FDA-1088. Where should I keep my medicine? Keep out of the reach of children. Store at room temperature between 20 and 25 degrees C (68 and 77 degrees F). Protect from light. Throw away any unused medicine after the expiration date. NOTE: This sheet is a summary. It may not cover all possible information. If you have questions about this medicine, talk to your doctor, pharmacist, or health care provider.  2018 Elsevier/Gold Standard (2015-04-01 05:39:22)   Standing Labs We placed an order today for your standing lab work.    Please come back and get your standing labs in 2 weeks, then 2 months, then every 3 months   We have open lab Monday through Friday from 8:30-11:30 AM and 1:30-4:00 PM  at the office of Dr. Bo Merino.   You may experience shorter wait times on Monday and Friday afternoons. The office is located at [address], [place], [place], [place] [address] No appointment is  necessary.   Labs are drawn by [facility].  You may receive a bill from [facility] for your lab work. If you have any questions regarding directions or hours of operation,  please call [phone].

## 2017-12-24 ENCOUNTER — Telehealth: Payer: Self-pay | Admitting: Rheumatology

## 2017-12-24 NOTE — Telephone Encounter (Signed)
Patient calling to see if lab work has come in yet. Patient has not been feeling very well, and wanted to see if anything is jumping out. Patient request a copy of all lab results be sent to her PCP, Leonia Reader, PA.

## 2017-12-25 LAB — PROTEIN ELECTROPHORESIS, SERUM, WITH REFLEX
ALBUMIN ELP: 3.9 g/dL (ref 3.8–4.8)
ALPHA 1: 0.3 g/dL (ref 0.2–0.3)
Alpha 2: 0.7 g/dL (ref 0.5–0.9)
BETA 2: 0.4 g/dL (ref 0.2–0.5)
BETA GLOBULIN: 0.5 g/dL (ref 0.4–0.6)
GAMMA GLOBULIN: 0.8 g/dL (ref 0.8–1.7)
Total Protein: 6.6 g/dL (ref 6.1–8.1)

## 2017-12-25 LAB — QUANTIFERON-TB GOLD PLUS
Mitogen-NIL: >10 IU/mL
NIL: 0.03 IU/mL
QuantiFERON-TB Gold Plus: NEGATIVE
TB1-NIL: 0 IU/mL
TB2-NIL: 0 IU/mL

## 2017-12-25 LAB — CBC WITH DIFFERENTIAL/PLATELET
BASOS PCT: 0.8 %
Basophils Absolute: 61 cells/uL (ref 0–200)
EOS PCT: 3 %
Eosinophils Absolute: 228 cells/uL (ref 15–500)
HCT: 42 % (ref 35.0–45.0)
Hemoglobin: 14.6 g/dL (ref 11.7–15.5)
LYMPHS ABS: 2136 {cells}/uL (ref 850–3900)
MCH: 32.7 pg (ref 27.0–33.0)
MCHC: 34.8 g/dL (ref 32.0–36.0)
MCV: 94.2 fL (ref 80.0–100.0)
MONOS PCT: 6.9 %
MPV: 10 fL (ref 7.5–12.5)
Neutro Abs: 4651 cells/uL (ref 1500–7800)
Neutrophils Relative %: 61.2 %
Platelets: 275 10*3/uL (ref 140–400)
RBC: 4.46 10*6/uL (ref 3.80–5.10)
RDW: 12.1 % (ref 11.0–15.0)
Total Lymphocyte: 28.1 %
WBC mixed population: 524 cells/uL (ref 200–950)
WBC: 7.6 10*3/uL (ref 3.8–10.8)

## 2017-12-25 LAB — HEPATITIS C ANTIBODY
Hepatitis C Ab: NONREACTIVE
SIGNAL TO CUT-OFF: 0.02 (ref <1.00)

## 2017-12-25 LAB — COMPLETE METABOLIC PANEL WITH GFR
AG Ratio: 1.8 (calc) (ref 1.0–2.5)
ALKALINE PHOSPHATASE (APISO): 71 U/L (ref 33–130)
ALT: 23 U/L (ref 6–29)
AST: 19 U/L (ref 10–35)
Albumin: 4.1 g/dL (ref 3.6–5.1)
BUN: 13 mg/dL (ref 7–25)
CALCIUM: 9 mg/dL (ref 8.6–10.4)
CHLORIDE: 106 mmol/L (ref 98–110)
CO2: 26 mmol/L (ref 20–32)
CREATININE: 0.68 mg/dL (ref 0.50–1.05)
GFR, EST AFRICAN AMERICAN: 111 mL/min/{1.73_m2} (ref >=60)
GFR, EST NON AFRICAN AMERICAN: 96 mL/min/{1.73_m2} (ref >=60)
GLUCOSE: 84 mg/dL (ref 65–99)
Globulin: 2.3 g/dL (calc) (ref 1.9–3.7)
Potassium: 4.1 mmol/L (ref 3.5–5.3)
Sodium: 137 mmol/L (ref 135–146)
TOTAL PROTEIN: 6.4 g/dL (ref 6.1–8.1)
Total Bilirubin: 0.5 mg/dL (ref 0.2–1.2)

## 2017-12-25 LAB — HEPATITIS B SURFACE ANTIGEN: HEP B S AG: NONREACTIVE

## 2017-12-25 LAB — HIV ANTIBODY (ROUTINE TESTING W REFLEX): HIV 1&2 Ab, 4th Generation: NONREACTIVE

## 2017-12-25 LAB — IGG, IGA, IGM
IgG (Immunoglobin G), Serum: 739 mg/dL (ref 694–1618)
IgM, Serum: 165 mg/dL (ref 48–271)
Immunoglobulin A: 334 mg/dL (ref 81–463)

## 2017-12-25 LAB — HEPATITIS B CORE ANTIBODY, IGM: HEP B C IGM: NONREACTIVE

## 2017-12-27 NOTE — Progress Notes (Signed)
All labs are WNL

## 2018-03-25 ENCOUNTER — Other Ambulatory Visit: Payer: Self-pay | Admitting: Family Medicine

## 2018-03-25 ENCOUNTER — Ambulatory Visit
Admission: RE | Admit: 2018-03-25 | Discharge: 2018-03-25 | Disposition: A | Discharge status: Home / Self Care | Payer: BLUE CROSS/BLUE SHIELD | Source: Ambulatory Visit | Attending: Family Medicine | Admitting: Family Medicine

## 2018-03-25 DIAGNOSIS — J22 Unspecified acute lower respiratory infection: Secondary | ICD-10-CM

## 2018-03-28 ENCOUNTER — Telehealth: Payer: Self-pay

## 2018-03-28 NOTE — Telephone Encounter (Signed)
Sent referral to scheduling, attached notes to august file

## 2018-04-08 ENCOUNTER — Telehealth: Payer: Self-pay

## 2018-04-08 NOTE — Telephone Encounter (Signed)
Notes on file.

## 2018-05-03 NOTE — Progress Notes (Deleted)
Office Visit Note  Patient: Jamie Mccall             Date of Birth: Aug 17, 1955           MRN: [id number]             PCP: Aletha Halim., PA-C Referring: Aletha Halim., PA-C Visit Date: 05/17/2018 Occupation: @GUAROCC @  Subjective:  No chief complaint on file.   History of Present Illness: Jamie Mccall is a 62 y.o. female ***   Activities of Daily Living:  Patient reports morning stiffness for *** {minute/hour:19697}.   Patient {ACTIONS;DENIES/REPORTS:21021675::"Denies"} nocturnal pain.  Difficulty dressing/grooming: {ACTIONS;DENIES/REPORTS:21021675::"Denies"} Difficulty climbing stairs: {ACTIONS;DENIES/REPORTS:21021675::"Denies"} Difficulty getting out of chair: {ACTIONS;DENIES/REPORTS:21021675::"Denies"} Difficulty using hands for taps, buttons, cutlery, and/or writing: {ACTIONS;DENIES/REPORTS:21021675::"Denies"}  No Rheumatology ROS completed.   PMFS History:  Patient Active Problem List   Diagnosis Date Noted  . 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[facility]) 10/13/2016  . History of corneal transplant 10/13/2016  . High risk medication use 10/13/2016  . Degenerative joint disease involving multiple joints 10/13/2016  . History of hypertension 10/13/2016  . History of depression 10/13/2016  . Morning joint stiffness 10/13/2016    Past Medical History:  Diagnosis Date  . Arthritis    RA    No family history on file. Past Surgical History:  Procedure Laterality Date  . EXCISION MASS UPPER EXTREMETIES Left 06/15/2017   Procedure: EXCISION MASS LEFT INDEX FINGER;  Surgeon: Daryll Brod, MD;  Location: [facility];  Service: Orthopedics;  Laterality: Left;  . EYE SURGERY     rt corneal transplants  . FOOT SURGERY Left 2018   cyst removed   . HAND SURGERY Right   . HAND SURGERY Left 2019   Dr. Fredna Dow   . MASS EXCISION Left 11/05/2016   Procedure: Excision of left foot mass;  Surgeon: Wylene Simmer, MD;  Location: [facility];  Service: Orthopedics;  Laterality: Left;   Social History   Social History Narrative  . Not on file    Objective: Vital Signs: There were no vitals taken for this visit.   Physical Exam   Musculoskeletal Exam: ***  CDAI Exam: CDAI Score: Not documented Patient Global Assessment: Not documented; Provider Global Assessment: Not documented Swollen: Not documented; Tender: Not documented Joint Exam   Not documented   There is currently no information documented on the homunculus. Go to the Rheumatology activity and complete the homunculus joint exam.  Investigation: No additional findings.  Imaging: No results found.  Recent Labs: Lab Results  Component Value Date   WBC 7.6 12/22/2017   HGB 14.6 12/22/2017   PLT 275 12/22/2017   NA 137 12/22/2017   K 4.1 12/22/2017   CL 106 12/22/2017   CO2 26 12/22/2017   GLUCOSE 84 12/22/2017   BUN 13 12/22/2017   CREATININE 0.68 12/22/2017   BILITOT 0.5 12/22/2017   ALKPHOS 61 10/13/2016   AST 19 12/22/2017   ALT 23 12/22/2017   PROT 6.4 12/22/2017   PROT 6.6 12/22/2017   ALBUMIN 3.7 10/13/2016   CALCIUM 9.0 12/22/2017   GFRAA 111 12/22/2017   QFTBGOLDPLUS NEGATIVE 12/22/2017    Speciality Comments: 06/27/14 DR. DEV ONLY -KBD  pt states she will not use Enbrel again/ causes Bronchitis,  will discuss other options in Jan 2017  Procedures:  No procedures performed Allergies: Enbrel [etanercept] and Sulfasalazine   Assessment / Plan:     Visit Diagnoses: No diagnosis found.   Orders: No orders of  the defined types were placed in this encounter.  No orders of the defined types were placed in this encounter.   Face-to-face time spent with patient was *** minutes. Greater than 50% of time was spent in counseling and coordination of care.  Follow-Up Instructions: No follow-ups on file.   Earnestine Mealing, CMA  Note - This record has been created using Editor, commissioning.  Chart creation errors have been  sought, but may not always  have been located. Such creation errors do not reflect on  the standard of medical care.

## 2018-05-17 ENCOUNTER — Ambulatory Visit: Payer: BLUE CROSS/BLUE SHIELD | Admitting: Rheumatology

## 2018-05-17 NOTE — Progress Notes (Deleted)
Office Visit Note  Patient: Jamie Mccall             Date of Birth: [birth year]           MRN: [id number]             PCP: Aletha Halim., PA-C Referring: Aletha Halim., PA-C Visit Date: 05/31/2018 Occupation: @GUAROCC @  Subjective:  No chief complaint on file.   History of Present Illness: Jamie Mccall is a 62 y.o. female ***   Activities of Daily Living:  Patient reports morning stiffness for *** {minute/hour:19697}.   Patient {ACTIONS;DENIES/REPORTS:21021675::"Denies"} nocturnal pain.  Difficulty dressing/grooming: {ACTIONS;DENIES/REPORTS:21021675::"Denies"} Difficulty climbing stairs: {ACTIONS;DENIES/REPORTS:21021675::"Denies"} Difficulty getting out of chair: {ACTIONS;DENIES/REPORTS:21021675::"Denies"} Difficulty using hands for taps, buttons, cutlery, and/or writing: {ACTIONS;DENIES/REPORTS:21021675::"Denies"}  No Rheumatology ROS completed.   PMFS History:  Patient Active Problem List   Diagnosis Date Noted  . 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[place]) 10/13/2016  . History of corneal transplant 10/13/2016  . High risk medication use 10/13/2016  . Degenerative joint disease involving multiple joints 10/13/2016  . History of hypertension 10/13/2016  . History of depression 10/13/2016  . Morning joint stiffness 10/13/2016    Past Medical History:  Diagnosis Date  . Arthritis    RA    No family history on file. Past Surgical History:  Procedure Laterality Date  . EXCISION MASS UPPER EXTREMETIES Left 06/15/2017   Procedure: EXCISION MASS LEFT INDEX FINGER;  Surgeon: Daryll Brod, MD;  Location: [facility];  Service: Orthopedics;  Laterality: Left;  . EYE SURGERY     rt corneal transplants  . FOOT SURGERY Left 2018   cyst removed   . HAND SURGERY Right   . HAND SURGERY Left 2019   Dr. Fredna Dow   . MASS EXCISION Left 11/05/2016   Procedure: Excision of left foot mass;  Surgeon: Wylene Simmer, MD;  Location: [facility];  Service: Orthopedics;  Laterality: Left;   Social History   Social History Narrative  . Not on file    Objective: Vital Signs: There were no vitals taken for this visit.   Physical Exam   Musculoskeletal Exam: ***  CDAI Exam: CDAI Score: Not documented Patient Global Assessment: Not documented; Provider Global Assessment: Not documented Swollen: Not documented; Tender: Not documented Joint Exam   Not documented   There is currently no information documented on the homunculus. Go to the Rheumatology activity and complete the homunculus joint exam.  Investigation: No additional findings.  Imaging: No results found.  Recent Labs: Lab Results  Component Value Date   WBC 7.6 12/22/2017   HGB 14.6 12/22/2017   PLT 275 12/22/2017   NA 137 12/22/2017   K 4.1 12/22/2017   CL 106 12/22/2017   CO2 26 12/22/2017   GLUCOSE 84 12/22/2017   BUN 13 12/22/2017   CREATININE 0.68 12/22/2017   BILITOT 0.5 12/22/2017   ALKPHOS 61 10/13/2016   AST 19 12/22/2017   ALT 23 12/22/2017   PROT 6.4 12/22/2017   PROT 6.6 12/22/2017   ALBUMIN 3.7 10/13/2016   CALCIUM 9.0 12/22/2017   GFRAA 111 12/22/2017   QFTBGOLDPLUS NEGATIVE 12/22/2017    Speciality Comments: 06/27/14 DR. DEV ONLY -KBD  pt states she will not use Enbrel again/ causes Bronchitis,  will discuss other options in Jan 2017  Procedures:  No procedures performed Allergies: Enbrel [etanercept] and Sulfasalazine   Assessment / Plan:     Visit Diagnoses: Rheumatoid arthritis of multiple sites with negative rheumatoid factor (  HCC) -  +CCP, +ANA, RNP  High risk medication use - Started on MTX 4 tablets by mouth once a week  Primary osteoarthritis of both hands  History of corneal transplant  History of hypertension  History of depression   Orders: No orders of the defined types were placed in this encounter.  No orders of the defined types were placed in this encounter.   Face-to-face time spent  with patient was *** minutes. Greater than 50% of time was spent in counseling and coordination of care.  Follow-Up Instructions: No follow-ups on file.   Ofilia Neas, PA-C  Note - This record has been created using Dragon software.  Chart creation errors have been sought, but may not always  have been located. Such creation errors do not reflect on  the standard of medical care.

## 2018-05-25 ENCOUNTER — Encounter: Payer: Self-pay | Admitting: *Deleted

## 2018-05-25 ENCOUNTER — Telehealth: Payer: Self-pay | Admitting: Rheumatology

## 2018-05-25 MED ORDER — PREDNISONE 5 MG PO TABS: ORAL_TABLET | ORAL | 0 refills | Status: DC

## 2018-05-25 NOTE — Telephone Encounter (Signed)
Patient states she is having pain and stiffness in bilateral hands and wrist. Patient states she has tried NSAIDS with little relief. Patient states "it is not a big flare, just affecting her life." Patient is also having pain and stiffness in her left knee. Patient is requesting a prescription for Prednisone. Patient has a follow up appointment 05/31/18. Please advise.

## 2018-05-25 NOTE — Telephone Encounter (Signed)
Patient called stating she is having a flair-up in her hands and has tried over the counter NSAIDS which is not helping.  Patient is requesting a prescription of Prednisone to be sent to [facility] on [address]

## 2018-05-25 NOTE — Telephone Encounter (Signed)
She had synovitis on exam at her last visit on 12/22/17.  She was advised to restart on MTX 4 tablets by mouth once weekly.  Has she been taking MTX? Is she having joint swelling?

## 2018-05-25 NOTE — Telephone Encounter (Signed)
Patient states she has not been taking the MTX because she was scared of it. Patient has made the decision to start the medication now. Patient has a follow up visit schedule for 05/31/18. Patient states she is not having "swelling or puffiness". Patient states "just knows it is her RA".   Per taylor okay to send in prescription for Prednisone 20 mg decreasing by 5 mg every 2 days.

## 2018-05-30 ENCOUNTER — Telehealth: Payer: Self-pay | Admitting: Rheumatology

## 2018-05-30 ENCOUNTER — Ambulatory Visit (INDEPENDENT_AMBULATORY_CARE_PROVIDER_SITE_OTHER): Payer: BLUE CROSS/BLUE SHIELD | Admitting: Rheumatology

## 2018-05-30 ENCOUNTER — Encounter: Payer: Self-pay | Admitting: Rheumatology

## 2018-05-30 ENCOUNTER — Ambulatory Visit (INDEPENDENT_AMBULATORY_CARE_PROVIDER_SITE_OTHER): Payer: Self-pay

## 2018-05-30 VITALS — BP 135/73 | HR 68 | Resp 12 | Ht 64.5 in | Wt 204.2 lb

## 2018-05-30 DIAGNOSIS — M0609 Rheumatoid arthritis without rheumatoid factor, multiple sites: Secondary | ICD-10-CM

## 2018-05-30 DIAGNOSIS — Z8679 Personal history of other diseases of the circulatory system: Secondary | ICD-10-CM | POA: Diagnosis not present

## 2018-05-30 DIAGNOSIS — M25562 Pain in left knee: Secondary | ICD-10-CM | POA: Diagnosis not present

## 2018-05-30 DIAGNOSIS — Z79899 Other long term (current) drug therapy: Secondary | ICD-10-CM | POA: Diagnosis not present

## 2018-05-30 DIAGNOSIS — M0579 Rheumatoid arthritis with rheumatoid factor of multiple sites without organ or systems involvement: Secondary | ICD-10-CM

## 2018-05-30 DIAGNOSIS — Z947 Corneal transplant status: Secondary | ICD-10-CM

## 2018-05-30 LAB — CBC WITH DIFFERENTIAL/PLATELET
BASOS ABS: 49 {cells}/uL (ref 0–200)
BASOS PCT: 0.6 %
EOS PCT: 0.4 %
Eosinophils Absolute: 32 cells/uL (ref 15–500)
HEMATOCRIT: 41.2 % (ref 35.0–45.0)
HEMOGLOBIN: 14.1 g/dL (ref 11.7–15.5)
LYMPHS ABS: 1418 {cells}/uL (ref 850–3900)
MCH: 32.8 pg (ref 27.0–33.0)
MCHC: 34.2 g/dL (ref 32.0–36.0)
MCV: 95.8 fL (ref 80.0–100.0)
MPV: 9.6 fL (ref 7.5–12.5)
Monocytes Relative: 4.7 %
Neutro Abs: 6221 cells/uL (ref 1500–7800)
Neutrophils Relative %: 76.8 %
Platelets: 319 10*3/uL (ref 140–400)
RBC: 4.3 10*6/uL (ref 3.80–5.10)
RDW: 11.9 % (ref 11.0–15.0)
Total Lymphocyte: 17.5 %
WBC mixed population: 381 cells/uL (ref 200–950)
WBC: 8.1 10*3/uL (ref 3.8–10.8)

## 2018-05-30 LAB — COMPLETE METABOLIC PANEL WITH GFR
AG Ratio: 1.7 (calc) (ref 1.0–2.5)
ALT: 16 U/L (ref 6–29)
AST: 14 U/L (ref 10–35)
Albumin: 4.2 g/dL (ref 3.6–5.1)
Alkaline phosphatase (APISO): 62 U/L (ref 33–130)
BUN: 18 mg/dL (ref 7–25)
CALCIUM: 9.4 mg/dL (ref 8.6–10.4)
CHLORIDE: 103 mmol/L (ref 98–110)
CO2: 26 mmol/L (ref 20–32)
Creat: 0.7 mg/dL (ref 0.50–0.99)
GFR, EST AFRICAN AMERICAN: 109 mL/min/{1.73_m2} (ref >=60)
GFR, Est Non African American: 94 mL/min/{1.73_m2} (ref >=60)
GLUCOSE: 100 mg/dL — AB (ref 65–99)
Globulin: 2.5 g/dL (calc) (ref 1.9–3.7)
POTASSIUM: 4.6 mmol/L (ref 3.5–5.3)
SODIUM: 138 mmol/L (ref 135–146)
Total Bilirubin: 0.6 mg/dL (ref 0.2–1.2)
Total Protein: 6.7 g/dL (ref 6.1–8.1)

## 2018-05-30 MED ORDER — LIDOCAINE HCL 1 % IJ SOLN
1.5000 mL | INTRAMUSCULAR | Status: AC | PRN
Start: 2018-05-30 — End: 2018-05-30
  Administered 2018-05-30: 1.5 mL

## 2018-05-30 MED ORDER — TRIAMCINOLONE ACETONIDE 40 MG/ML IJ SUSP
40.0000 mg | INTRAMUSCULAR | Status: AC | PRN
  Administered 2018-05-30: 40 mg via INTRA_ARTICULAR

## 2018-05-30 MED ORDER — FOLIC ACID 1 MG PO TABS: 1.0000 mg | ORAL_TABLET | Freq: Every day | ORAL | 3 refills | Status: DC

## 2018-05-30 MED ORDER — METHOTREXATE 2.5 MG PO TABS: 12.5000 mg | ORAL_TABLET | ORAL | 0 refills | Status: DC

## 2018-05-30 NOTE — Progress Notes (Addendum)
Office Visit Note  Patient: Jamie Mccall             Date of Birth: 1956-07-01           MRN: [id number]             PCP: Aletha Halim., PA-C Referring: Aletha Halim., PA-C Visit Date: 05/30/2018 Occupation: @GUAROCC @  Subjective:  Left knee pain  History of Present Illness: Jamie Mccall is a 62 y.o. female with history of seropositive rheumatoid arthritis.  She was seen last in May 2019 at the time she was given a prescription for methotrexate.  She states she never started the methotrexate prescription.  Now she has been experiencing increased pain and swelling in her bilateral hands and wrist.  She called [place] to get a prednisone taper which was just called in.  She is feeling better on prednisone taper.  She has been also experiencing pain in her left knee for the last 2 days.  She has difficulty walking.  She describes the pain is sharp and comes and goes.  She denies any joint swelling.  She tried ibuprofen with an Aleve which were only helpful to some extent.  She has been using CBD oil.     Activities of Daily Living:  Patient reports morning stiffness for 0  minutes.   Patient Denies nocturnal pain.  Difficulty dressing/grooming: Denies Difficulty climbing stairs: Reports Difficulty getting out of chair: Reports Difficulty using hands for taps, buttons, cutlery, and/or writing: Reports  Review of Systems  Constitutional: Negative for fatigue.  HENT: Negative for mouth sores, mouth dryness and nose dryness.   Eyes: Negative for pain, visual disturbance and dryness.  Respiratory: Negative for cough, hemoptysis, shortness of breath and difficulty breathing.   Cardiovascular: Negative for chest pain, palpitations, hypertension and swelling in legs/feet.  Gastrointestinal: Negative for blood in stool, constipation and diarrhea.  Endocrine: Negative for increased urination.  Genitourinary: Negative for painful urination.  Musculoskeletal: Positive for  arthralgias, joint pain and joint swelling. Negative for myalgias, muscle weakness, morning stiffness, muscle tenderness and myalgias.  Skin: Negative for color change, pallor, rash, hair loss, nodules/bumps, skin tightness, ulcers and sensitivity to sunlight.  Allergic/Immunologic: Negative for susceptible to infections.  Neurological: Negative for dizziness, numbness, headaches and weakness.  Hematological: Negative for swollen glands.  Psychiatric/Behavioral: Negative for depressed mood and sleep disturbance. The patient is not nervous/anxious.     PMFS History:  Patient Active Problem List   Diagnosis Date Noted  . Bilateral hand pain 04/30/2017  . Mass 04/30/2017  . High risk medication use 10/13/2016  . Degenerative joint disease involving multiple joints 10/13/2016  . History of hypertension 10/13/2016  . History of depression 10/13/2016  . Morning joint stiffness 10/13/2016  . Glaucoma suspect, bilateral 02/16/2014  . Primary open angle glaucoma (POAG) 01/18/2014  . Punctal stenosis, acquired, bilateral 01/18/2014  . Epiphora 01/05/2014  . Pseudophakia 09/06/2013  . 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 Ptosis 07/31/2013  . PCO (posterior capsular opacification) 03/13/2013  . History of corneal transplant 08/12/2012  . 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[place]) 05/04/2012  . 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 Arthritis    RA    Family History  Problem Relation Age of Onset  . Hypertension Father   . Cancer Mother   . Hypertension Mother    Past Surgical History:  Procedure Laterality Date  . EXCISION MASS  UPPER EXTREMETIES Left 06/15/2017   Procedure: EXCISION MASS LEFT INDEX FINGER;  Surgeon: Daryll Brod, MD;  Location: [facility];  Service: Orthopedics;  Laterality: Left;  . EYE SURGERY     rt corneal transplants  . FOOT SURGERY Left 2018   cyst removed    . HAND SURGERY Right   . HAND SURGERY Left 2019   Dr. Fredna Dow   . MASS EXCISION Left 11/05/2016   Procedure: Excision of left foot mass;  Surgeon: Wylene Simmer, MD;  Location: [facility];  Service: Orthopedics;  Laterality: Left;   Social History   Social History Narrative  . Not on file    Objective: Vital Signs: BP 135/73 (BP Location: Left Arm, Patient Position: Sitting, Cuff Size: Large)   Pulse 68   Resp 12   Ht 5' 4.5" (1.638 m)   Wt 204 lb 3.2 oz (92.6 kg)   BMI 34.51 kg/m    Physical Exam  Constitutional: She is oriented to person, place, and time. She appears well-developed and well-nourished.  HENT:  Head: Normocephalic and atraumatic.  Eyes: Conjunctivae and EOM are normal.  Right eye corneal transplant  Neck: Normal range of motion.  Cardiovascular: Normal rate, regular rhythm, normal heart sounds and intact distal pulses.  Pulmonary/Chest: Effort normal and breath sounds normal.  Abdominal: Soft. Bowel sounds are normal.  Lymphadenopathy:    She has no cervical adenopathy.  Neurological: She is alert and oriented to person, place, and time.  Skin: Skin is warm and dry. Capillary refill takes less than 2 seconds.  Psychiatric: She has a normal mood and affect. Her behavior is normal.  Nursing note and vitals reviewed.    Musculoskeletal Exam: C-spine good range of motion.  Thoracic and lumbar spine good range of motion.  Shoulder joints elbow joints wrist joints were in good range of motion.  She has tenderness over bilateral wrist joints.  She has synovial thickening over her right second MCP joint.  She had crepitus in her left knee joint without any warmth swelling or effusion.  CDAI Exam: CDAI Score: 6.2  Patient Global Assessment: 6 (mm); Provider Global Assessment: 6 (mm) Swollen: 1 ; Tender: 4  Joint Exam      Right  Left  Wrist   Tender   Tender  MCP 2  Swollen Tender     Knee      Tender     Investigation: No additional  findings.  Imaging: Xr Knee 3 View Left  Result Date: 05/30/2018 Mild to moderate medial compartment narrowing was noted.  Moderate patellofemoral narrowing was noted.  No chondrocalcinosis was noted. 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   Recent Labs: Lab Results  Component Value Date   WBC 7.6 12/22/2017   HGB 14.6 12/22/2017   PLT 275 12/22/2017   NA 137 12/22/2017   K 4.1 12/22/2017   CL 106 12/22/2017   CO2 26 12/22/2017   GLUCOSE 84 12/22/2017   BUN 13 12/22/2017   CREATININE 0.68 12/22/2017   BILITOT 0.5 12/22/2017   ALKPHOS 61 10/13/2016   AST 19 12/22/2017   ALT 23 12/22/2017   PROT 6.4 12/22/2017   PROT 6.6 12/22/2017   ALBUMIN 3.7 10/13/2016   CALCIUM 9.0 12/22/2017   GFRAA 111 12/22/2017   QFTBGOLDPLUS NEGATIVE 12/22/2017    Speciality Comments: 06/27/14 DR. DEV ONLY -KBD  pt states she will not use Enbrel again/ causes Bronchitis,    Procedures:  Large  Joint Inj: L knee on 05/30/2018 2:30 PM Indications: pain Details: 27 G 1.5 in needle, medial approach  Arthrogram: No  Medications: 40 mg triamcinolone acetonide 40 MG/ML; 1.5 mL lidocaine 1 % Aspirate: 0 mL Outcome: tolerated well, no immediate complications Procedure, treatment alternatives, risks and benefits explained, specific risks discussed. Consent was given by the patient. Immediately prior to procedure a time out was called to verify the correct patient, procedure, equipment, support staff and site/side marked as required. Patient was prepped and draped in the usual sterile fashion.     Allergies: Enbrel [etanercept] and Sulfasalazine   Assessment / Plan:     Visit Diagnoses: Rheumatoid arthritis of multiple sites with positive rheumatoid factor ([place]) -patient did not start methotrexate as prescribed during the last visit in May.  She had recent flare of she requested prednisone.  She is on prednisone taper currently.  She still have mild  synovitis and synovial thickening on examination.  She requested going back on methotrexate.  Indications side effects contraindications were revised.  She was given a prescription for methotrexate 5 tablets p.o. weekly.  She will also take folic acid 1 mg p.o. daily.  Plan: methotrexate (RHEUMATREX) 2.5 MG tablet  High risk medication use - MTX 5 tablets  weekly and folic acid 1 mg p.o. daily were called in today.- Plan: CBC with Differential/Platelet, COMPLETE METABOLIC PANEL WITH GFR today, in 1 month and then every 3 months.  Acute pain of left knee -she describes sharp pain intermittently in her left knee joint especially while walking.  Plan: XR KNEE 3 VIEW LEFT.  The x-rays show mild to moderate osteoarthritis and chondromalacia patella.  The patient's request of the knee joint was injected with cortisone.  the procedure as described above.  History of corneal transplant  History of hypertension -her blood pressure is  controlled.  Orders: Orders Placed This Encounter  Procedures  . Large Joint Inj  . XR KNEE 3 VIEW LEFT  . CBC with Differential/Platelet  . COMPLETE METABOLIC PANEL WITH GFR   Meds ordered this encounter  Medications  . methotrexate (RHEUMATREX) 2.5 MG tablet    Sig: Take 5 tablets (12.5 mg total) by mouth once a week. Caution:Chemotherapy. Protect from light.    Dispense:  60 tablet    Refill:  0  . folic acid (FOLVITE) 1 MG tablet    Sig: Take 1 tablet (1 mg total) by mouth daily.    Dispense:  90 tablet    Refill:  3    Face-to-face time spent with patient was 30 minutes. Greater than 50% of time was spent in counseling and coordination of care.  Follow-Up Instructions: Return in about 3 months (around 08/30/2018) for Rheumatoid arthritis.   Bo Merino, MD  Note - This record has been created using Editor, commissioning.  Chart creation errors have been sought, but may not always  have been located. Such creation errors do not reflect on  the standard  of medical care.

## 2018-05-30 NOTE — Telephone Encounter (Signed)
Spoke with patient and reschedule patient to 05/30/18 at 1:30 pm with Dr. Estanislado Pandy to have her knee evaluated.

## 2018-05-30 NOTE — Patient Instructions (Signed)
Standing Labs We placed an order today for your standing lab work.    Please come back and get your standing labs in November and then every 3 months  We have open lab Monday through Friday from 8:30-11:30 AM and 1:30-4:00 PM  at the office of Dr. Bo Merino.   You may experience shorter wait times on Monday and Friday afternoons. The office is located at [address] [place], [place], [place] [address] No appointment is necessary.   Labs are drawn by [facility].  You may receive a bill from [facility] for your lab work. If you have any questions regarding directions or hours of operation,  please call [phone].   Just as a reminder please drink plenty of water prior to coming for your lab work. Thanks!

## 2018-05-30 NOTE — Telephone Encounter (Signed)
Patient called stating she has left knee pain.  Patient states the pain began on Friday 10/18 and today she is unable to put any weight on it.  Patient states the pain is on the outside of the knee.  Patient has appointment tomorrow 11/22 but requesting a return call to let her know "if Dr. Estanislado Pandy can address this issue of if she needs to see an orthopedic doctor."

## 2018-05-31 ENCOUNTER — Ambulatory Visit: Payer: BLUE CROSS/BLUE SHIELD | Admitting: Physician Assistant

## 2018-06-08 ENCOUNTER — Ambulatory Visit: Payer: BLUE CROSS/BLUE SHIELD | Admitting: Cardiology

## 2018-06-10 ENCOUNTER — Telehealth: Payer: Self-pay

## 2018-06-10 NOTE — Telephone Encounter (Signed)
SENT REFERRAL TO SCHEDULING AND FILED NOTES 

## 2018-06-13 ENCOUNTER — Telehealth: Payer: Self-pay

## 2018-06-13 NOTE — Telephone Encounter (Signed)
Sent referral to scheduling and filed notes 

## 2018-06-14 ENCOUNTER — Ambulatory Visit: Payer: BLUE CROSS/BLUE SHIELD | Admitting: Cardiology

## 2018-06-22 ENCOUNTER — Telehealth: Payer: Self-pay | Admitting: Rheumatology

## 2018-06-22 NOTE — Telephone Encounter (Signed)
Attempted to contact the patient and left message for patient to call the office.  

## 2018-06-22 NOTE — Telephone Encounter (Signed)
Patient calling to let you know knee is bothering her again. Knee is tender right now, patient can WB on leg. Injection helped for about three weeks. Please call to advise patient as what she can do. Patient wearing knee brace, and using Ibuprofen.

## 2018-06-23 ENCOUNTER — Telehealth: Payer: Self-pay | Admitting: Rheumatology

## 2018-06-23 NOTE — Telephone Encounter (Signed)
Noted  

## 2018-06-23 NOTE — Telephone Encounter (Signed)
Patient states her knee is feeling a little better today and wants to wait to schedule an appointment.  Patient states she started the Methotrexate and thinks it is helping.

## 2018-08-16 NOTE — Progress Notes (Deleted)
Office Visit Note  Patient: Jamie Mccall             Date of Birth: 1956-07-09           MRN: [id number]             PCP: Aletha Halim., PA-C Referring: Aletha Halim., PA-C Visit Date: 08/30/2018 Occupation: @GUAROCC @  Subjective:  No chief complaint on file.  Methotrexate 5 tablets weekly along with folic acid 1 mg daily.  Most recent CBC/CMP within normal limits on 08/19/2018 and will monitor every 3 months.  Standing orders are in place.  Recommend annual influenza, Pneumovax 23, Prevnar 13, and Shingrix as indicated.  History of Present Illness: Jamie Mccall is a 63 y.o. female ***   Activities of Daily Living:  Patient reports morning stiffness for *** {minute/hour:19697}.   Patient {ACTIONS;DENIES/REPORTS:21021675::"Denies"} nocturnal pain.  Difficulty dressing/grooming: {ACTIONS;DENIES/REPORTS:21021675::"Denies"} Difficulty climbing stairs: {ACTIONS;DENIES/REPORTS:21021675::"Denies"} Difficulty getting out of chair: {ACTIONS;DENIES/REPORTS:21021675::"Denies"} Difficulty using hands for taps, buttons, cutlery, and/or writing: {ACTIONS;DENIES/REPORTS:21021675::"Denies"}  No Rheumatology ROS completed.   PMFS History:  Patient Active Problem List   Diagnosis Date Noted  . Bilateral hand pain 04/30/2017  . Mass 04/30/2017  . High risk medication use 10/13/2016  . Degenerative joint disease involving multiple joints 10/13/2016  . History of hypertension 10/13/2016  . History of depression 10/13/2016  . Morning joint stiffness 10/13/2016  . Glaucoma suspect, bilateral 02/16/2014  . Primary open angle glaucoma (POAG) 01/18/2014  . Punctal stenosis, acquired, bilateral 01/18/2014  . Epiphora 01/05/2014  . Pseudophakia 09/06/2013  . 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 Ptosis 07/31/2013  . PCO (posterior capsular opacification) 03/13/2013  . History of corneal transplant 08/12/2012  . 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[place]) 05/04/2012  .  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 Arthritis    RA    Family History  Problem Relation Age of Onset  . Hypertension Father   . Cancer Mother   . Hypertension Mother    Past Surgical History:  Procedure Laterality Date  . EXCISION MASS UPPER EXTREMETIES Left 06/15/2017   Procedure: EXCISION MASS LEFT INDEX FINGER;  Surgeon: Daryll Brod, MD;  Location: [facility];  Service: Orthopedics;  Laterality: Left;  . EYE SURGERY     rt corneal transplants  . FOOT SURGERY Left 2018   cyst removed   . HAND SURGERY Right   . HAND SURGERY Left 2019   Dr. Fredna Dow   . MASS EXCISION Left 11/05/2016   Procedure: Excision of left foot mass;  Surgeon: Wylene Simmer, MD;  Location: [facility];  Service: Orthopedics;  Laterality: Left;   Social History   Social History Narrative  . Not on file    Objective: Vital Signs: There were no vitals taken for this visit.   Physical Exam   Musculoskeletal Exam: ***  CDAI Exam: CDAI Score: Not documented Patient Global Assessment: Not documented; Provider Global Assessment: Not documented Swollen: Not documented; Tender: Not documented Joint Exam   Not documented   There is currently no information documented on the homunculus. Go to the Rheumatology activity and complete the homunculus joint exam.  Investigation: No additional findings.  Imaging: No results found.  Recent Labs: Lab Results  Component Value Date   WBC 8.1 05/30/2018   HGB 14.1 05/30/2018   PLT 319 05/30/2018   NA 138 05/30/2018   K 4.6 05/30/2018   CL  103 05/30/2018   CO2 26 05/30/2018   GLUCOSE 100 (H) 05/30/2018   BUN 18 05/30/2018   CREATININE 0.70 05/30/2018   BILITOT 0.6 05/30/2018   ALKPHOS 61 10/13/2016   AST 14 05/30/2018   ALT 16 05/30/2018   PROT 6.7 05/30/2018   ALBUMIN 3.7 10/13/2016   CALCIUM 9.4 05/30/2018   GFRAA 109 05/30/2018    QFTBGOLDPLUS NEGATIVE 12/22/2017    Speciality Comments: 06/27/14 DR. DEV ONLY -KBD  pt states she will not use Enbrel again/ causes Bronchitis,    Procedures:  No procedures performed Allergies: Enbrel [etanercept] and Sulfasalazine   Assessment / Plan:     Visit Diagnoses: No diagnosis found.   Orders: No orders of the defined types were placed in this encounter.  No orders of the defined types were placed in this encounter.   Face-to-face time spent with patient was *** minutes. Greater than 50% of time was spent in counseling and coordination of care.  Follow-Up Instructions: No follow-ups on file.   Earnestine Mealing, CMA  Note - This record has been created using Editor, commissioning.  Chart creation errors have been sought, but may not always  have been located. Such creation errors do not reflect on  the standard of medical care.

## 2018-08-18 HISTORY — PX: SQUAMOUS CELL CARCINOMA EXCISION: SHX2433

## 2018-08-19 ENCOUNTER — Telehealth: Payer: Self-pay | Admitting: Rheumatology

## 2018-08-19 ENCOUNTER — Other Ambulatory Visit: Payer: Self-pay | Admitting: *Deleted

## 2018-08-19 DIAGNOSIS — Z79899 Other long term (current) drug therapy: Secondary | ICD-10-CM

## 2018-08-19 NOTE — Telephone Encounter (Signed)
Patient in office for lab work. Patient states she is having increased joint pain and achyness. Patient is taking 600 mg of Ibuprofen a day and  MTX 5 tabs weekly.  Per patient, this helped in the beginning. Now patient is achy, and taking Advil with MTX. Patient advised she should not take Ibuprofen with the MTX. Patient would like to know if MTX should be increased now, after two months? Please advise.

## 2018-08-19 NOTE — Telephone Encounter (Signed)
Patient is taking 600mg  a day of MTX. Per patient, this helped in the beginning. Now patient is achy, and taking Advil with MTX. Patient would like to know if MTX should be increased now, after two months? Is Advil okay to take with it. Please call to advise.

## 2018-08-19 NOTE — Telephone Encounter (Signed)
Okay to repeat increase methotrexate to 7 tablets p.o. weekly.  She is getting labs today.  We can call her after the labs if any adjustment needed in the dosage.

## 2018-08-20 LAB — COMPLETE METABOLIC PANEL WITH GFR
AG Ratio: 1.7 (calc) (ref 1.0–2.5)
ALBUMIN MSPROF: 4 g/dL (ref 3.6–5.1)
ALKALINE PHOSPHATASE (APISO): 60 U/L (ref 33–130)
ALT: 17 U/L (ref 6–29)
AST: 17 U/L (ref 10–35)
BILIRUBIN TOTAL: 0.5 mg/dL (ref 0.2–1.2)
BUN: 13 mg/dL (ref 7–25)
CHLORIDE: 103 mmol/L (ref 98–110)
CO2: 27 mmol/L (ref 20–32)
Calcium: 9.2 mg/dL (ref 8.6–10.4)
Creat: 0.64 mg/dL (ref 0.50–0.99)
GFR, Est African American: 112 mL/min/{1.73_m2} (ref >=60)
GFR, Est Non African American: 97 mL/min/{1.73_m2} (ref >=60)
GLOBULIN: 2.3 g/dL (ref 1.9–3.7)
Glucose, Bld: 83 mg/dL (ref 65–99)
Potassium: 4.7 mmol/L (ref 3.5–5.3)
SODIUM: 139 mmol/L (ref 135–146)
Total Protein: 6.3 g/dL (ref 6.1–8.1)

## 2018-08-20 LAB — CBC WITH DIFFERENTIAL/PLATELET
Absolute Monocytes: 529 cells/uL (ref 200–950)
Basophils Absolute: 47 cells/uL (ref 0–200)
Basophils Relative: 0.6 %
EOS PCT: 2.6 %
Eosinophils Absolute: 205 cells/uL (ref 15–500)
HCT: 40.7 % (ref 35.0–45.0)
Hemoglobin: 13.9 g/dL (ref 11.7–15.5)
Lymphs Abs: 2528 cells/uL (ref 850–3900)
MCH: 33.3 pg — ABNORMAL HIGH (ref 27.0–33.0)
MCHC: 34.2 g/dL (ref 32.0–36.0)
MCV: 97.4 fL (ref 80.0–100.0)
MONOS PCT: 6.7 %
MPV: 10 fL (ref 7.5–12.5)
NEUTROS PCT: 58.1 %
Neutro Abs: 4590 cells/uL (ref 1500–7800)
PLATELETS: 320 10*3/uL (ref 140–400)
RBC: 4.18 10*6/uL (ref 3.80–5.10)
RDW: 13.3 % (ref 11.0–15.0)
TOTAL LYMPHOCYTE: 32 %
WBC: 7.9 10*3/uL (ref 3.8–10.8)

## 2018-08-22 NOTE — Telephone Encounter (Signed)
Patient advised when in the office for labs to increase MTX to 7 tablets.

## 2018-08-30 ENCOUNTER — Ambulatory Visit: Payer: BLUE CROSS/BLUE SHIELD | Admitting: Rheumatology

## 2018-08-31 NOTE — Progress Notes (Signed)
Office Visit Note  Patient: Jamie Mccall             Date of Birth: 02/27/1956           MRN: [id number]             PCP: Aletha Halim., PA-C Referring: Aletha Halim., PA-C Visit Date: 09/14/2018 Occupation: @GUAROCC @  Subjective:  Left ankle pain and swelling   History of Present Illness: Jamie Mccall is a 63 y.o. female with history of seropositive rheumatoid arthritis.  She was taking MTX 5 tablets po once weekly and folic acid 2 mg po daily, but she increased to 7 tablets by mouth once weekly for one dose. She states she has been off of MTX for the past 2 weeks due to developing body aches and congestion.  She denies any fevers.  She was taking mucinex, but her symptoms have improved and she is planning on resuming MTX.  She states she did not feel like MTX at 5 tablets was effective.  She continues to have pain in the left wrist, right hand, and left ankle joint.  She has noticed swelling in the left ankle joint.  She has been seeing a podiatrist for bilateral feet pain.  She has pes planus, and reports if she wears orthotics and support shoes her discomfort improves. She takes advil for pain relief which is helpful.   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   Activities of Daily Living:  Patient reports morning stiffness for 2 minutes.   Patient Denies nocturnal pain.  Difficulty dressing/grooming: Denies Difficulty climbing stairs: Reports Difficulty getting out of chair: Denies Difficulty using hands for taps, buttons, cutlery, and/or writing: Denies  Review of Systems  Constitutional: Negative for fatigue.  HENT: Negative for mouth sores, mouth dryness and nose dryness.   Eyes: Negative for pain, visual disturbance and dryness.  Respiratory: Negative for cough, hemoptysis, shortness of breath and difficulty breathing.   Cardiovascular: Negative for chest pain, palpitations, hypertension and swelling in legs/feet.   Gastrointestinal: Negative for blood in stool, constipation and diarrhea.  Endocrine: Negative for increased urination.  Genitourinary: Negative for painful urination.  Musculoskeletal: Positive for arthralgias, joint pain, joint swelling and morning stiffness. Negative for myalgias, muscle weakness, muscle tenderness and myalgias.  Skin: Negative for color change, pallor, rash, hair loss, nodules/bumps, skin tightness, ulcers and sensitivity to sunlight.  Allergic/Immunologic: Negative for susceptible to infections.  Neurological: Negative for dizziness, numbness, headaches and weakness.  Hematological: Negative for swollen glands.  Psychiatric/Behavioral: Negative for depressed mood and sleep disturbance. The patient is not nervous/anxious.     PMFS History:  Patient Active Problem List   Diagnosis Date Noted  . Bilateral hand pain 04/30/2017  . Mass 04/30/2017  . High risk medication use 10/13/2016  . Degenerative joint disease involving multiple joints 10/13/2016  . History of hypertension 10/13/2016  . History of depression 10/13/2016  . Morning joint stiffness 10/13/2016  . Glaucoma suspect, bilateral 02/16/2014  . Primary open angle glaucoma (POAG) 01/18/2014  . Punctal stenosis, acquired, bilateral 01/18/2014  . Epiphora 01/05/2014  . Pseudophakia 09/06/2013  . 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 Ptosis 07/31/2013  . PCO (posterior capsular opacification) 03/13/2013  . History of corneal transplant 08/12/2012  . 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[place]) 05/04/2012  . 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 Arthritis  RA    Family History  Problem Relation Age of Onset  . Hypertension Father   . Cancer Mother   . Hypertension Mother    Past Surgical History:  Procedure Laterality Date  . EXCISION MASS UPPER EXTREMETIES Left 06/15/2017   Procedure: EXCISION MASS LEFT  INDEX FINGER;  Surgeon: Daryll Brod, MD;  Location: [facility];  Service: Orthopedics;  Laterality: Left;  . EYE SURGERY     rt corneal transplants  . FOOT SURGERY Left 2018   cyst removed   . HAND SURGERY Right   . HAND SURGERY Left 2019   Dr. Fredna Dow   . MASS EXCISION Left 11/05/2016   Procedure: Excision of left foot mass;  Surgeon: Wylene Simmer, MD;  Location: [facility];  Service: Orthopedics;  Laterality: Left;  . 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 Not on file   Immunization History  Administered Date(s) Administered  . Influenza,inj,Quad PF,6+ Mos 08/18/2017, 08/18/2017     Objective: Vital Signs: BP (!) 149/93 (BP Location: Left Arm, Patient Position: Sitting, Cuff Size: Normal)   Pulse 71   Resp 14   Ht 5\' 5"  (1.651 m)   Wt 204 lb 9.6 oz (92.8 kg)   BMI 34.05 kg/m    Physical Exam Vitals signs and nursing note reviewed.  Constitutional:      Appearance: She is well-developed.  HENT:     Head: Normocephalic and atraumatic.  Eyes:     Conjunctiva/sclera: Conjunctivae normal.  Neck:     Musculoskeletal: Normal range of motion.  Cardiovascular:     Rate and Rhythm: Normal rate and regular rhythm.     Heart sounds: Normal heart sounds.  Pulmonary:     Effort: Pulmonary effort is normal.     Breath sounds: Normal breath sounds.  Abdominal:     General: Bowel sounds are normal.     Palpations: Abdomen is soft.  Lymphadenopathy:     Cervical: No cervical adenopathy.  Skin:    General: Skin is warm and dry.     Capillary Refill: Capillary refill takes less than 2 seconds.  Neurological:     Mental Status: She is alert and oriented to person, place, and time.  Psychiatric:        Behavior: Behavior normal.      Musculoskeletal Exam: C-spine, thoracic spine, and lumbar spine good ROM.  No midline spinal tenderness.  No SI joint tenderness.  Shoulder joints, elbow  joints, and wrist joints good ROM with no synovitis.  Rheumatoid nodule present on extensor surface of left elbow.  Synovitis of right 1st and 2nd MCP joints.  Hip joints and knee joints good ROM with no warmth or effusion.  Left ankle joint tenderness and swelling.  No tenderness of MTPs.  Pes planus.  No tenderness over trochanteric bursa bilaterally.   CDAI Exam: CDAI Score: 5.8  Patient Global Assessment: 4 (mm); Provider Global Assessment: 4 (mm) Swollen: 3 ; Tender: 4  Joint Exam      Right  Left  Wrist      Tender  MCP 1  Swollen Tender     MCP 2  Swollen Tender     Ankle     Swollen Tender     Investigation: No additional findings.  Imaging: No results found.  Recent Labs: Lab Results  Component Value Date   WBC 7.9 08/19/2018   HGB 13.9  08/19/2018   PLT 320 08/19/2018   NA 139 08/19/2018   K 4.7 08/19/2018   CL 103 08/19/2018   CO2 27 08/19/2018   GLUCOSE 83 08/19/2018   BUN 13 08/19/2018   CREATININE 0.64 08/19/2018   BILITOT 0.5 08/19/2018   ALKPHOS 61 10/13/2016   AST 17 08/19/2018   ALT 17 08/19/2018   PROT 6.3 08/19/2018   ALBUMIN 3.7 10/13/2016   CALCIUM 9.2 08/19/2018   GFRAA 112 08/19/2018   QFTBGOLDPLUS NEGATIVE 12/22/2017    Speciality Comments: 06/27/14 DR. DEV ONLY -KBD  pt states she will not use Enbrel again/ causes Bronchitis,    Procedures:  No procedures performed Allergies: Enbrel [etanercept] and Sulfasalazine   Assessment / Plan:     Visit Diagnoses: Rheumatoid arthritis of multiple sites with positive rheumatoid factor ([place]): She continues to have active synovitis.  She has synovitis of the right 1st, 2nd, 3rd MCPs and the left ankle joint.  She has tenderness of the left wrist joint on exam.  She restarted on MTX 5 tablets po once weekly in October 2019.  She initially noticed significant benefit at this dose, but she started having increased arthralgias about 1 month ago and increased to 7 tablets for 1 dose. She has missed 2  doses of MTX recently due to have an URI, but she plans on resuming MTX. She continues to use CBD oil.  She plans on taking Advil very sparingly for pain relief, and we will monitor lab work closely. We discussed other treatment options or combination therapy options but she declined at this time. She is apprehensive to be on any other immunosuppressive therapies due to a history of recurrent infections.  She will continue on MTX 7 tablets po once weekly and folic acid 2 mg po daily. She does not need any refills at this time. She was advised to notify us if she develops increased pain or joint swelling.  She will follow up in 5 months.   High risk medication use - MTX 7 tablets po once weekly, folic acid 2 mg po daily.  Labs WNL on 08/19/18.  She will return for lab work in April and every 3 months. Standing orders are in place.     History of hypertension  History of corneal transplant   Orders: No orders of the defined types were placed in this encounter.  No orders of the defined types were placed in this encounter.  Face-to-face time spent patient was over 30 minutes.  Greater than 50% time spent in counseling and coordination of care.  Follow-Up Instructions: Return in about 5 months (around 02/12/2019) for Rheumatoid arthritis.   Ofilia Neas, PA-C   I examined and evaluated the patient with Hazel Sams PA.  She has been off methotrexate due to recent upper respiratory tract infection.  She has synovitis on examination.  She will resume methotrexate.  She is hesitant to go on more aggressive therapy due to frequent infections.  She believes that taking Advil on PRN basis is helpful.  At this point she will try Advil on PRN basis.  She also discussed possible use of low-dose prednisone.  I would like to hold off unless she has ongoing discomfort.  She does not want to add any other immunosuppressive agents.  I discussed possible option of adding low-dose Arava 10 mg p.o. daily to  methotrexate.  The plan of care was discussed as noted above.  Bo Merino, MD  Note - This record has been  created using [employer].  Chart creation errors have been sought, but may not always  have been located. Such creation errors do not reflect on  the standard of medical care.

## 2018-09-14 ENCOUNTER — Ambulatory Visit: Payer: BLUE CROSS/BLUE SHIELD | Admitting: Rheumatology

## 2018-09-14 ENCOUNTER — Encounter: Payer: Self-pay | Admitting: Rheumatology

## 2018-09-14 ENCOUNTER — Encounter (INDEPENDENT_AMBULATORY_CARE_PROVIDER_SITE_OTHER): Payer: Self-pay

## 2018-09-14 VITALS — BP 149/93 | HR 71 | Resp 14 | Ht 65.0 in | Wt 204.6 lb

## 2018-09-14 DIAGNOSIS — Z8679 Personal history of other diseases of the circulatory system: Secondary | ICD-10-CM

## 2018-09-14 DIAGNOSIS — M0579 Rheumatoid arthritis with rheumatoid factor of multiple sites without organ or systems involvement: Secondary | ICD-10-CM

## 2018-09-14 DIAGNOSIS — Z79899 Other long term (current) drug therapy: Secondary | ICD-10-CM

## 2018-09-14 DIAGNOSIS — M0609 Rheumatoid arthritis without rheumatoid factor, multiple sites: Secondary | ICD-10-CM | POA: Diagnosis not present

## 2018-09-14 DIAGNOSIS — Z947 Corneal transplant status: Secondary | ICD-10-CM

## 2018-09-14 NOTE — Patient Instructions (Signed)
Standing Labs We placed an order today for your standing lab work.    Please come back and get your standing labs in April and every 3 months  We have open lab Monday through Friday from 8:30-11:30 AM and 1:30-4:00 PM  at the office of Dr. Shaili Deveshwar.   You may experience shorter wait times on Monday and Friday afternoons. The office is located at [address], [address], [place], [place] [address] No appointment is necessary.   Labs are drawn by [facility].  You may receive a bill from [facility] for your lab work.  If you wish to have your labs drawn at another location, please call the office 24 hours in advance to send orders.  If you have any questions regarding directions or hours of operation,  please call [phone].   Just as a reminder please drink plenty of water prior to coming for your lab work. Thanks!  

## 2018-09-21 ENCOUNTER — Telehealth: Payer: Self-pay | Admitting: Rheumatology

## 2018-09-21 DIAGNOSIS — M0609 Rheumatoid arthritis without rheumatoid factor, multiple sites: Secondary | ICD-10-CM

## 2018-09-21 MED ORDER — METHOTREXATE 2.5 MG PO TABS: 17.5000 mg | ORAL_TABLET | ORAL | 0 refills | Status: DC

## 2018-09-21 NOTE — Telephone Encounter (Signed)
Patient left a voicemail requesting prescription refill of Methotrexate.   

## 2018-09-21 NOTE — Telephone Encounter (Signed)
Last Visit: 09/14/18 Next visit: 02/14/19 Labs: 08/19/18 WNL  Okay to refill per Dr. Estanislado Pandy

## 2018-10-04 ENCOUNTER — Other Ambulatory Visit: Payer: Self-pay | Admitting: Rheumatology

## 2018-10-04 DIAGNOSIS — M0609 Rheumatoid arthritis without rheumatoid factor, multiple sites: Secondary | ICD-10-CM

## 2018-10-07 ENCOUNTER — Telehealth: Payer: Self-pay | Admitting: Rheumatology

## 2018-10-07 NOTE — Telephone Encounter (Signed)
Patient has been trying to get her rx for MTX for several weeks now, and per patient pharmacy still does not have refill. Patient is completely out at this point, and very irritated at this point. Patient needs refill sent to [facility]. Please call patient to advise.

## 2018-10-07 NOTE — Telephone Encounter (Signed)
Prescription for MTX was sent to the pharmacy on 09/21/18. Spoke with [name] and verified they have received prescription. Advised them to get the prescription ready for the patient. Advised patient the pharmacy has the prescription and is getting it ready.

## 2018-10-29 ENCOUNTER — Telehealth: Payer: Self-pay | Admitting: Physician Assistant

## 2018-10-29 NOTE — Telephone Encounter (Signed)
Patient called the on-call service with questions regarding whether she should continue to take methotrexate since she is at a high risk for infection.  She has been off of MTX for 2 months due to taking more NSAIDs more frequently due to increased lower back pain.  She is apprehensive to restart due to being at higher risk for infection if she takes MTX.  We discussed the risks and benefits of restarting MTX.  She is going to restart taking MTX 5 tablets po once weekly.  She is aware that if she experiences any signs or symptoms of an infection she will hold the dose until the infection has resolved.   She also asked about the benefits of taking PLQ for the treatment of coronavirus.  We discussed the use of PLQ for acute treatment of coronavirus.   All questions were addressed.   She was advised to call us back if she has any further questions.    Hazel Sams, PA-C

## 2018-11-04 ENCOUNTER — Other Ambulatory Visit: Payer: Self-pay | Admitting: Physician Assistant

## 2018-11-04 ENCOUNTER — Telehealth: Payer: Self-pay

## 2018-11-04 ENCOUNTER — Other Ambulatory Visit: Payer: Self-pay

## 2018-11-04 DIAGNOSIS — Z79899 Other long term (current) drug therapy: Secondary | ICD-10-CM

## 2018-11-04 LAB — COMPLETE METABOLIC PANEL WITH GFR
AG Ratio: 1.7 (calc) (ref 1.0–2.5)
ALBUMIN MSPROF: 4.3 g/dL (ref 3.6–5.1)
ALT: 49 U/L — AB (ref 6–29)
AST: 27 U/L (ref 10–35)
Alkaline phosphatase (APISO): 63 U/L (ref 37–153)
BILIRUBIN TOTAL: 0.5 mg/dL (ref 0.2–1.2)
BUN: 15 mg/dL (ref 7–25)
CALCIUM: 9.2 mg/dL (ref 8.6–10.4)
CO2: 26 mmol/L (ref 20–32)
CREATININE: 0.62 mg/dL (ref 0.50–0.99)
Chloride: 99 mmol/L (ref 98–110)
GFR, EST AFRICAN AMERICAN: 114 mL/min/{1.73_m2} (ref >=60)
GFR, EST NON AFRICAN AMERICAN: 98 mL/min/{1.73_m2} (ref >=60)
GLUCOSE: 90 mg/dL (ref 65–139)
Globulin: 2.5 g/dL (calc) (ref 1.9–3.7)
Potassium: 4.2 mmol/L (ref 3.5–5.3)
Sodium: 133 mmol/L — ABNORMAL LOW (ref 135–146)
TOTAL PROTEIN: 6.8 g/dL (ref 6.1–8.1)

## 2018-11-04 LAB — CBC WITH DIFFERENTIAL/PLATELET
Absolute Monocytes: 238 cells/uL (ref 200–950)
BASOS PCT: 1 %
Basophils Absolute: 68 cells/uL (ref 0–200)
EOS ABS: 150 {cells}/uL (ref 15–500)
Eosinophils Relative: 2.2 %
HCT: 41.7 % (ref 35.0–45.0)
HEMOGLOBIN: 14.6 g/dL (ref 11.7–15.5)
Lymphs Abs: 2332 cells/uL (ref 850–3900)
MCH: 33.9 pg — AB (ref 27.0–33.0)
MCHC: 35 g/dL (ref 32.0–36.0)
MCV: 96.8 fL (ref 80.0–100.0)
MONOS PCT: 3.5 %
MPV: 9.4 fL (ref 7.5–12.5)
NEUTROS ABS: 4012 {cells}/uL (ref 1500–7800)
Neutrophils Relative %: 59 %
Platelets: 314 10*3/uL (ref 140–400)
RBC: 4.31 10*6/uL (ref 3.80–5.10)
RDW: 12.2 % (ref 11.0–15.0)
Total Lymphocyte: 34.3 %
WBC: 6.8 10*3/uL (ref 3.8–10.8)

## 2018-11-04 MED ORDER — FOLINIC ACID-VIT B6-VIT B12 4-50-2 MG PO TABS: ORAL_TABLET | ORAL | 0 refills | Status: DC

## 2018-11-04 NOTE — Telephone Encounter (Signed)
Patient was advised to have CBC and CMP drawn today before 5pm. I placed a lab order for a methotrexate level to be checked. [facility] is unable to change the status to stat and cannot draw the level since it is after 3 pm. The patient was advised to have the level drawn tomorrow and if the level is elevated, the patient will start taking folinic acid 5 mg 3 tablets by mouth every 6 hours until the level of MTX is 10mg /m2.   A prescription for folinic acid 5 mg 3 tablets by mouth every 6 hours was send to the pharmacy.   I have spoke with Dr. Estanislado Pandy and she agrees with the above plan.  If the patient's CBC and CMP are abnormal we will send the patient to the ED for further evaluation.

## 2018-11-04 NOTE — Telephone Encounter (Signed)
Patient called and states she did not read the medication bottle for MTX and has been taking 5 tablets daily for the past 4-5 days.   I called Hazel Sams, PA-C who then spoke with Dr. Estanislado Pandy and they advised the patient get labs drawn today. I called patient to advise her to go to the [facility] on [address] before 5pm today.

## 2018-11-05 NOTE — Progress Notes (Signed)
I called patient and discussed lab results with her last night. Patient stated that lab drew her MTX level which will be processed on Monday. I advised her to hold MTX until we get the results of MTX serum level.

## 2018-11-07 ENCOUNTER — Other Ambulatory Visit: Payer: Self-pay | Admitting: *Deleted

## 2018-11-07 DIAGNOSIS — Z79899 Other long term (current) drug therapy: Secondary | ICD-10-CM

## 2018-11-07 LAB — CBC WITH DIFFERENTIAL/PLATELET
ABSOLUTE MONOCYTES: 490 {cells}/uL (ref 200–950)
Basophils Absolute: 50 cells/uL (ref 0–200)
Basophils Relative: 0.7 %
Eosinophils Absolute: 170 cells/uL (ref 15–500)
Eosinophils Relative: 2.4 %
HEMATOCRIT: 38.6 % (ref 35.0–45.0)
Hemoglobin: 13.3 g/dL (ref 11.7–15.5)
LYMPHS ABS: 1995 {cells}/uL (ref 850–3900)
MCH: 33.8 pg — ABNORMAL HIGH (ref 27.0–33.0)
MCHC: 34.5 g/dL (ref 32.0–36.0)
MCV: 98.2 fL (ref 80.0–100.0)
MPV: 9.4 fL (ref 7.5–12.5)
Monocytes Relative: 6.9 %
NEUTROS PCT: 61.9 %
Neutro Abs: 4395 cells/uL (ref 1500–7800)
PLATELETS: 266 10*3/uL (ref 140–400)
RBC: 3.93 10*6/uL (ref 3.80–5.10)
RDW: 12.1 % (ref 11.0–15.0)
Total Lymphocyte: 28.1 %
WBC: 7.1 10*3/uL (ref 3.8–10.8)

## 2018-11-07 LAB — COMPLETE METABOLIC PANEL WITH GFR
AG Ratio: 1.8 (calc) (ref 1.0–2.5)
ALT: 31 U/L — ABNORMAL HIGH (ref 6–29)
AST: 19 U/L (ref 10–35)
Albumin: 3.9 g/dL (ref 3.6–5.1)
Alkaline phosphatase (APISO): 64 U/L (ref 37–153)
BUN: 13 mg/dL (ref 7–25)
CO2: 30 mmol/L (ref 20–32)
Calcium: 9.2 mg/dL (ref 8.6–10.4)
Chloride: 102 mmol/L (ref 98–110)
Creat: 0.94 mg/dL (ref 0.50–0.99)
GFR, Est African American: 76 mL/min/{1.73_m2} (ref >=60)
GFR, Est Non African American: 66 mL/min/{1.73_m2} (ref >=60)
Globulin: 2.2 g/dL (calc) (ref 1.9–3.7)
Glucose, Bld: 91 mg/dL (ref 65–99)
Potassium: 4.5 mmol/L (ref 3.5–5.3)
Sodium: 138 mmol/L (ref 135–146)
Total Bilirubin: 0.4 mg/dL (ref 0.2–1.2)
Total Protein: 6.1 g/dL (ref 6.1–8.1)

## 2018-11-07 LAB — METHOTREXATE LEVEL

## 2018-11-07 NOTE — Progress Notes (Signed)
Please advise patient to go for lab work today.  Recheck CBC and CMP.

## 2018-11-07 NOTE — Progress Notes (Signed)
Methotrexate level was canceled due to not being protected from light.

## 2018-11-07 NOTE — Progress Notes (Signed)
Please notify patient that methotrexate level was canceled by the lab as the test could not be performed.  I would recommend for her to have repeat CBC and CMP in 1 week.  If her labs are normal then she can resume methotrexate after 1 week.

## 2018-11-08 ENCOUNTER — Telehealth: Payer: Self-pay | Admitting: *Deleted

## 2018-11-08 DIAGNOSIS — Z79899 Other long term (current) drug therapy: Secondary | ICD-10-CM

## 2018-11-08 NOTE — Telephone Encounter (Signed)
-----   Message from Ofilia Neas, PA-C sent at 11/08/2018  8:31 AM EDT ----- ALT is borderline elevated-31 but trending down.  Recheck in 1 week.   CBC stable.

## 2018-11-16 ENCOUNTER — Telehealth: Payer: Self-pay | Admitting: Rheumatology

## 2018-11-16 DIAGNOSIS — Z79899 Other long term (current) drug therapy: Secondary | ICD-10-CM

## 2018-11-16 NOTE — Telephone Encounter (Signed)
Patient called requesting labwork orders be sent to [facility] on [address].  Patient states she will be going today.

## 2018-11-16 NOTE — Telephone Encounter (Signed)
Lab Orders released.  

## 2018-11-17 LAB — CBC WITH DIFFERENTIAL/PLATELET
Absolute Monocytes: 496 cells/uL (ref 200–950)
Basophils Absolute: 53 cells/uL (ref 0–200)
Basophils Relative: 0.9 %
Eosinophils Absolute: 142 cells/uL (ref 15–500)
Eosinophils Relative: 2.4 %
HCT: 39.5 % (ref 35.0–45.0)
Hemoglobin: 13.8 g/dL (ref 11.7–15.5)
Lymphs Abs: 2030 cells/uL (ref 850–3900)
MCH: 33.9 pg — ABNORMAL HIGH (ref 27.0–33.0)
MCHC: 34.9 g/dL (ref 32.0–36.0)
MCV: 97.1 fL (ref 80.0–100.0)
MPV: 9.4 fL (ref 7.5–12.5)
Monocytes Relative: 8.4 %
Neutro Abs: 3180 cells/uL (ref 1500–7800)
Neutrophils Relative %: 53.9 %
Platelets: 345 10*3/uL (ref 140–400)
RBC: 4.07 10*6/uL (ref 3.80–5.10)
RDW: 12.5 % (ref 11.0–15.0)
Total Lymphocyte: 34.4 %
WBC: 5.9 10*3/uL (ref 3.8–10.8)

## 2018-11-17 LAB — COMPLETE METABOLIC PANEL WITH GFR
AG Ratio: 1.6 (calc) (ref 1.0–2.5)
ALT: 24 U/L (ref 6–29)
AST: 18 U/L (ref 10–35)
Albumin: 3.9 g/dL (ref 3.6–5.1)
Alkaline phosphatase (APISO): 65 U/L (ref 37–153)
BUN: 12 mg/dL (ref 7–25)
CO2: 29 mmol/L (ref 20–32)
Calcium: 9.4 mg/dL (ref 8.6–10.4)
Chloride: 100 mmol/L (ref 98–110)
Creat: 0.7 mg/dL (ref 0.50–0.99)
GFR, Est African American: 109 mL/min/{1.73_m2} (ref >=60)
GFR, Est Non African American: 94 mL/min/{1.73_m2} (ref >=60)
Globulin: 2.5 g/dL (calc) (ref 1.9–3.7)
Glucose, Bld: 89 mg/dL (ref 65–99)
Potassium: 4.3 mmol/L (ref 3.5–5.3)
Sodium: 136 mmol/L (ref 135–146)
Total Bilirubin: 0.5 mg/dL (ref 0.2–1.2)
Total Protein: 6.4 g/dL (ref 6.1–8.1)

## 2018-11-30 ENCOUNTER — Telehealth (INDEPENDENT_AMBULATORY_CARE_PROVIDER_SITE_OTHER): Payer: BLUE CROSS/BLUE SHIELD | Admitting: Rheumatology

## 2018-11-30 ENCOUNTER — Encounter: Payer: Self-pay | Admitting: Rheumatology

## 2018-11-30 ENCOUNTER — Telehealth: Payer: Self-pay | Admitting: Rheumatology

## 2018-11-30 DIAGNOSIS — Z79899 Other long term (current) drug therapy: Secondary | ICD-10-CM

## 2018-11-30 DIAGNOSIS — Z8659 Personal history of other mental and behavioral disorders: Secondary | ICD-10-CM

## 2018-11-30 DIAGNOSIS — Z947 Corneal transplant status: Secondary | ICD-10-CM

## 2018-11-30 DIAGNOSIS — M19042 Primary osteoarthritis, left hand: Secondary | ICD-10-CM

## 2018-11-30 DIAGNOSIS — M0609 Rheumatoid arthritis without rheumatoid factor, multiple sites: Secondary | ICD-10-CM

## 2018-11-30 DIAGNOSIS — Z8679 Personal history of other diseases of the circulatory system: Secondary | ICD-10-CM

## 2018-11-30 DIAGNOSIS — M0579 Rheumatoid arthritis with rheumatoid factor of multiple sites without organ or systems involvement: Secondary | ICD-10-CM

## 2018-11-30 DIAGNOSIS — M19041 Primary osteoarthritis, right hand: Secondary | ICD-10-CM

## 2018-11-30 MED ORDER — PREDNISONE 5 MG PO TABS: ORAL_TABLET | ORAL | 0 refills | Status: DC

## 2018-11-30 NOTE — Telephone Encounter (Signed)
Patient calling due to a flare in right hand. Flare started 1 week ago, progressively gotten worse. Patient uses [facility] on [address]. Please call to advise. Patient states flares happen with nerves, and she is very much on nerve with COVID.

## 2018-11-30 NOTE — Telephone Encounter (Signed)
Patient is scheduled for a virtual visit today with Dr. Estanislado Pandy.

## 2018-11-30 NOTE — Progress Notes (Signed)
Virtual Visit via Telephone Note  I connected with Jamie Mccall on 11/30/18 at 11:15 AM EDT by telephone from my office and verified that I am speaking with the correct person using two identifiers.Patient was reached at home.   I discussed the limitations, risks, security and privacy concerns of performing an evaluation and management service by telephone and the availability of in person appointments. I also discussed with the patient that there may be a patient responsible charge related to this service. The patient expressed understanding and agreed to proceed.  CC: Right 2nd MCP joint pain   History of Present Illness: Patient is a 63 year old female with a past medical history of seropositive rheumatoid arthritis.  She is taking MTX 5 tablets once weekly and folic acid 2 mg po daily.  She resumed MTX last Wednesday. She is due for her next dose today. She is currently having a RA flare.  She is having right 2nd MCP joint pain and joint swelling.  She is having difficulty with ADLs.  She has no other joint pain or joint swelling.  She would like a prednisone taper.   Review of Systems  Constitutional: Negative for fever and malaise/fatigue.  Eyes: Negative for photophobia, pain, discharge and redness.  Respiratory: Negative for cough, shortness of breath and wheezing.   Cardiovascular: Negative for chest pain and palpitations.  Gastrointestinal: Negative for blood in stool, constipation and diarrhea.  Genitourinary: Negative for dysuria.  Musculoskeletal: Positive for joint pain. Negative for back pain, myalgias and neck pain.       +Joint swelling  Skin: Negative for rash.  Neurological: Negative for dizziness and headaches.  Psychiatric/Behavioral: Positive for depression. The patient is nervous/anxious. The patient does not have insomnia.      Observations/Objective: Physical Exam  Constitutional: She is oriented to person, place, and time.  Neurological: She is alert and  oriented to person, place, and time.  Psychiatric: Mood, memory, affect and judgment normal.   Patient reports morning stiffness for 0 minutes.   Patient denies nocturnal pain.  Difficulty dressing/grooming: Denies Difficulty climbing stairs: Denies Difficulty getting out of chair: Denies Difficulty using hands for taps, buttons, cutlery, and/or writing: Reports   Assessment and Plan: Rheumatoid arthritis of multiple sites with positive rheumatoid factor ([place]): She is currently having a rheumatoid arthritis flare. She is having pain and swelling in the right 2nd MCP joint. She states the flare was induced by increased stress and anxiety related to COVID-19.  She has been trying to work on stress relieving tactics.  She continues to use CBD cream topically with only mild relief.  She resumed taking MTX 5 tablets po once weekly last Wednesday.  She is due for her next dose today.  She was advised to increase to MTX 7 tablets po once weekly.  We will send in a prednisone taper starting at 20 mg and she will reduce by 5 mg every 2 days.  She will notify us if her joint pain and joint swelling returns following the prednisone taper.  She will follow up on 02/14/19.  High risk medication use - MTX 7 tablets po once weekly and folic acid 2 mg po daily.  CBC and CMP were drawn on 11/16/18. LFTs returned to WNL at that time. She is due to update lab work in July and every 3 months.    Primary osteoarthritis of both hands-She is having pain in both hands.  She has right 2nd MCP joint pain and swelling.  She is having difficulty with ADLs.  History of corneal transplant   Follow Up Instructions: She will follow up on 02/14/19.  She will update labs are follow up visit.  A prednisone taper will be sent to the pharmacy.    I discussed the assessment and treatment plan with the patient. The patient was provided an opportunity to ask questions and all were answered. The patient agreed with the plan and  demonstrated an understanding of the instructions.   The patient was advised to call back or seek an in-person evaluation if the symptoms worsen or if the condition fails to improve as anticipated.  I provided 25 minutes of non-face-to-face time during this encounter.  Bo Merino, MD   Scribed by-  Ofilia Neas, PA-C

## 2019-01-31 NOTE — Progress Notes (Deleted)
Office Visit Note  Patient: Jamie Mccall             Date of Birth: [birth year]           MRN: [id number]             PCP: Aletha Halim., PA-C Referring: Aletha Halim., PA-C Visit Date: 02/14/2019 Occupation: @GUAROCC @  Subjective:  No chief complaint on file.   History of Present Illness: Jamie Mccall is a 63 y.o. female ***   Activities of Daily Living:  Patient reports morning stiffness for *** {minute/hour:19697}.   Patient {ACTIONS;DENIES/REPORTS:21021675::"Denies"} nocturnal pain.  Difficulty dressing/grooming: {ACTIONS;DENIES/REPORTS:21021675::"Denies"} Difficulty climbing stairs: {ACTIONS;DENIES/REPORTS:21021675::"Denies"} Difficulty getting out of chair: {ACTIONS;DENIES/REPORTS:21021675::"Denies"} Difficulty using hands for taps, buttons, cutlery, and/or writing: {ACTIONS;DENIES/REPORTS:21021675::"Denies"}  No Rheumatology ROS completed.   PMFS History:  Patient Active Problem List   Diagnosis Date Noted  . Bilateral hand pain 04/30/2017  . Mass 04/30/2017  . High risk medication use 10/13/2016  . Degenerative joint disease involving multiple joints 10/13/2016  . History of hypertension 10/13/2016  . History of depression 10/13/2016  . Morning joint stiffness 10/13/2016  . Glaucoma suspect, bilateral 02/16/2014  . Primary open angle glaucoma (POAG) 01/18/2014  . Punctal stenosis, acquired, bilateral 01/18/2014  . Epiphora 01/05/2014  . Pseudophakia 09/06/2013  . 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 Ptosis 07/31/2013  . PCO (posterior capsular opacification) 03/13/2013  . History of corneal transplant 08/12/2012  . 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[place]) 05/04/2012  . 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 Arthritis    RA    Family History  Problem Relation Age of Onset  . Hypertension Father   . Cancer Mother   . Hypertension Mother     Past Surgical History:  Procedure Laterality Date  . EXCISION MASS UPPER EXTREMETIES Left 06/15/2017   Procedure: EXCISION MASS LEFT INDEX FINGER;  Surgeon: Daryll Brod, MD;  Location: [facility];  Service: Orthopedics;  Laterality: Left;  . EYE SURGERY     rt corneal transplants  . FOOT SURGERY Left 2018   cyst removed   . HAND SURGERY Right   . HAND SURGERY Left 2019   Dr. Fredna Dow   . MASS EXCISION Left 11/05/2016   Procedure: Excision of left foot mass;  Surgeon: Wylene Simmer, MD;  Location: [facility];  Service: Orthopedics;  Laterality: Left;  . 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 Not on file   Immunization History  Administered Date(s) Administered  . Influenza,inj,Quad PF,6+ Mos 08/18/2017, 08/18/2017     Objective: Vital Signs: There were no vitals taken for this visit.   Physical Exam   Musculoskeletal Exam: ***  CDAI Exam: CDAI Score: - Patient Global: -; Provider Global: - Swollen: -; Tender: - Joint Exam   No joint exam has been documented for this visit   There is currently no information documented on the homunculus. Go to the Rheumatology activity and complete the homunculus joint exam.  Investigation: No additional findings.  Imaging: No results found.  Recent Labs: Lab Results  Component Value Date   WBC 5.9 11/16/2018   HGB 13.8 11/16/2018   PLT 345 11/16/2018   NA 136 11/16/2018   K 4.3 11/16/2018   CL 100 11/16/2018   CO2 29 11/16/2018   GLUCOSE 89 11/16/2018   BUN 12  11/16/2018   CREATININE 0.70 11/16/2018   BILITOT 0.5 11/16/2018   ALKPHOS 61 10/13/2016   AST 18 11/16/2018   ALT 24 11/16/2018   PROT 6.4 11/16/2018   ALBUMIN 3.7 10/13/2016   CALCIUM 9.4 11/16/2018   GFRAA 109 11/16/2018   QFTBGOLDPLUS NEGATIVE 12/22/2017    Speciality Comments: 06/27/14 DR. DEV ONLY -KBD  pt states she will not use Enbrel again/ causes  Bronchitis,    Procedures:  No procedures performed Allergies: Enbrel [etanercept] and Sulfasalazine   Assessment / Plan:     Visit Diagnoses: No diagnosis found.   Orders: No orders of the defined types were placed in this encounter.  No orders of the defined types were placed in this encounter.   Face-to-face time spent with patient was *** minutes. Greater than 50% of time was spent in counseling and coordination of care.  Follow-Up Instructions: No follow-ups on file.   Earnestine Mealing, CMA  Note - This record has been created using Editor, commissioning.  Chart creation errors have been sought, but may not always  have been located. Such creation errors do not reflect on  the standard of medical care.

## 2019-02-02 ENCOUNTER — Telehealth: Payer: Self-pay | Admitting: *Deleted

## 2019-02-02 NOTE — Telephone Encounter (Signed)
Attempted to contact the patient and left message for patient to call the office.   Per Dr. Estanislado Pandy patient will need to contact the PCP for Lexapro as it is more for depression. Per Dr. Estanislado Pandy it is up to patient if she contnue to stay off of MTX.

## 2019-02-02 NOTE — Telephone Encounter (Signed)
Patient wants a RX for Lexapro, [facility] on [address]. Patient off MTX since 01/09/2019 - not helping.

## 2019-02-03 NOTE — Telephone Encounter (Signed)
Patient advised per Dr. Estanislado Pandy she contact her PCP for the Lexapro. Patient states she has wasted two days on this. Patient advised that this was Dr. Arlean Hopping recommendation. Patient states she had trouble getting through. Apologized to patient as we have been short staffed this week.

## 2019-02-14 ENCOUNTER — Ambulatory Visit: Payer: Self-pay | Admitting: Rheumatology

## 2019-04-11 ENCOUNTER — Telehealth: Payer: Self-pay | Admitting: Rheumatology

## 2019-04-11 ENCOUNTER — Ambulatory Visit (INDEPENDENT_AMBULATORY_CARE_PROVIDER_SITE_OTHER): Payer: BC Managed Care – PPO | Admitting: Physician Assistant

## 2019-04-11 ENCOUNTER — Other Ambulatory Visit: Payer: Self-pay

## 2019-04-11 ENCOUNTER — Encounter: Payer: Self-pay | Admitting: Physician Assistant

## 2019-04-11 VITALS — BP 155/78 | HR 68 | Resp 15 | Ht 64.0 in | Wt 205.0 lb

## 2019-04-11 DIAGNOSIS — M19042 Primary osteoarthritis, left hand: Secondary | ICD-10-CM

## 2019-04-11 DIAGNOSIS — Z8679 Personal history of other diseases of the circulatory system: Secondary | ICD-10-CM

## 2019-04-11 DIAGNOSIS — M79644 Pain in right finger(s): Secondary | ICD-10-CM | POA: Diagnosis not present

## 2019-04-11 DIAGNOSIS — M19041 Primary osteoarthritis, right hand: Secondary | ICD-10-CM | POA: Diagnosis not present

## 2019-04-11 DIAGNOSIS — M25532 Pain in left wrist: Secondary | ICD-10-CM | POA: Diagnosis not present

## 2019-04-11 DIAGNOSIS — M0609 Rheumatoid arthritis without rheumatoid factor, multiple sites: Secondary | ICD-10-CM | POA: Diagnosis not present

## 2019-04-11 DIAGNOSIS — M0579 Rheumatoid arthritis with rheumatoid factor of multiple sites without organ or systems involvement: Secondary | ICD-10-CM

## 2019-04-11 DIAGNOSIS — Z947 Corneal transplant status: Secondary | ICD-10-CM | POA: Diagnosis not present

## 2019-04-11 DIAGNOSIS — Z8659 Personal history of other mental and behavioral disorders: Secondary | ICD-10-CM

## 2019-04-11 DIAGNOSIS — Z79899 Other long term (current) drug therapy: Secondary | ICD-10-CM

## 2019-04-11 MED ORDER — LIDOCAINE HCL 1 % IJ SOLN
0.3000 mL | INTRAMUSCULAR | Status: AC | PRN
Start: 2019-04-11 — End: 2019-04-11
  Administered 2019-04-11: .3 mL

## 2019-04-11 MED ORDER — TRIAMCINOLONE ACETONIDE 40 MG/ML IJ SUSP
20.0000 mg | INTRAMUSCULAR | Status: AC | PRN
Start: 2019-04-11 — End: 2019-04-11
  Administered 2019-04-11: 20 mg via INTRA_ARTICULAR

## 2019-04-11 MED ORDER — PREDNISONE 5 MG PO TABS: ORAL_TABLET | ORAL | 0 refills | Status: DC

## 2019-04-11 MED ORDER — LIDOCAINE HCL 1 % IJ SOLN
1.0000 mL | INTRAMUSCULAR | Status: AC | PRN
Start: 2019-04-11 — End: 2019-04-11
  Administered 2019-04-11: 1 mL

## 2019-04-11 MED ORDER — TRIAMCINOLONE ACETONIDE 40 MG/ML IJ SUSP
10.0000 mg | INTRAMUSCULAR | Status: AC | PRN
  Administered 2019-04-11: 10 mg

## 2019-04-11 NOTE — Progress Notes (Signed)
Office Visit Note  Patient: Jamie Mccall             Date of Birth: 08-27-1955           MRN: [id number]             PCP: Aletha Halim., PA-C Referring: Aletha Halim., PA-C Visit Date: 04/11/2019 Occupation: @GUAROCC @  Subjective:  Pain in left wrist pain   History of Present Illness: Jamie Mccall is a 63 y.o. female with history of seropositive rheumatoid arthritis and osteoarthritis.  She is no longer taking methotrexate or folic acid and does not wish to restart at this time. She discontinued 2 months ago due to not noticing any improvement while on MTX 8 tablets weekly. She does not wish to "experiment with other medications at this time."  She presents today with pain in the left wrist joint pain and right 2nd MCP joint pain and swelling.  She states the left wrist pain started 5 days ago and has progressively been getting worse. She had nocturnal pain in the left wrist last night.  She works on the computer throughout the day. She started taking meloxicam yesterday but is unsure of the dose.  She also uses CBD oil on a daily basis.   She has been under increased stress at work.  She is following up with her PCP to discuss restarting on Lexapro or another antidepressant.   Activities of Daily Living:  Patient reports morning stiffness for 0 minutes.   Patient reports nocturnal pain.  Difficulty dressing/grooming: Denies Difficulty climbing stairs: Denies Difficulty getting out of chair: Denies Difficulty using hands for taps, buttons, cutlery, and/or writing: reports   Review of Systems  Constitutional: Negative for fatigue.  HENT: Negative for mouth sores, mouth dryness and nose dryness.   Eyes: Negative for pain, visual disturbance and dryness.  Respiratory: Negative for cough, hemoptysis, shortness of breath and difficulty breathing.   Cardiovascular: Negative for chest pain, palpitations, hypertension and swelling in legs/feet.  Gastrointestinal: Negative  for blood in stool, constipation and diarrhea.  Endocrine: Negative for increased urination.  Genitourinary: Negative for painful urination.  Musculoskeletal: Positive for joint swelling. Negative for myalgias, muscle weakness, morning stiffness, muscle tenderness and myalgias.  Skin: Negative for color change, pallor, rash, hair loss, nodules/bumps, skin tightness, ulcers and sensitivity to sunlight.  Allergic/Immunologic: Negative for susceptible to infections.  Neurological: Negative for dizziness, numbness, headaches and weakness.  Hematological: Negative for swollen glands.  Psychiatric/Behavioral: Negative for depressed mood and sleep disturbance. The patient is not nervous/anxious.     PMFS History:  Patient Active Problem List   Diagnosis Date Noted  . Bilateral hand pain 04/30/2017  . Mass 04/30/2017  . High risk medication use 10/13/2016  . Degenerative joint disease involving multiple joints 10/13/2016  . History of hypertension 10/13/2016  . History of depression 10/13/2016  . Morning joint stiffness 10/13/2016  . Glaucoma suspect, bilateral 02/16/2014  . Primary open angle glaucoma (POAG) 01/18/2014  . Punctal stenosis, acquired, bilateral 01/18/2014  . Epiphora 01/05/2014  . Pseudophakia 09/06/2013  . 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 Ptosis 07/31/2013  . PCO (posterior capsular opacification) 03/13/2013  . History of corneal transplant 08/12/2012  . 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[place]) 05/04/2012  . 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 Arthritis    RA    Family History  Problem Relation Age of Onset  .  Hypertension Father   . Cancer Mother   . Hypertension Mother    Past Surgical History:  Procedure Laterality Date  . EXCISION MASS UPPER EXTREMETIES Left 06/15/2017   Procedure: EXCISION MASS LEFT INDEX FINGER;  Surgeon: Daryll Brod, MD;  Location: [facility];  Service: Orthopedics;  Laterality: Left;  . EYE SURGERY     rt corneal transplants  . FOOT SURGERY Left 2018   cyst removed   . HAND SURGERY Right   . HAND SURGERY Left 2019   Dr. Fredna Dow   . MASS EXCISION Left 11/05/2016   Procedure: Excision of left foot mass;  Surgeon: Wylene Simmer, MD;  Location: [facility];  Service: Orthopedics;  Laterality: Left;  . 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 Not on file   Immunization History  Administered Date(s) Administered  . Influenza,inj,Quad PF,6+ Mos 08/18/2017, 08/18/2017     Objective: Vital Signs: BP (!) 179/91 (BP Location: Left Arm, Patient Position: Sitting, Cuff Size: Normal)   Pulse 82   Resp 15   Ht 5\' 4"  (1.626 m)   Wt 205 lb (93 kg)   BMI 35.19 kg/m    Physical Exam Vitals signs and nursing note reviewed.  Constitutional:      Appearance: She is well-developed.  HENT:     Head: Normocephalic and atraumatic.  Eyes:     Conjunctiva/sclera: Conjunctivae normal.  Neck:     Musculoskeletal: Normal range of motion.  Cardiovascular:     Rate and Rhythm: Normal rate and regular rhythm.     Heart sounds: Normal heart sounds.  Pulmonary:     Effort: Pulmonary effort is normal.     Breath sounds: Normal breath sounds.  Abdominal:     General: Bowel sounds are normal.     Palpations: Abdomen is soft.  Lymphadenopathy:     Cervical: No cervical adenopathy.  Skin:    General: Skin is warm and dry.     Capillary Refill: Capillary refill takes less than 2 seconds.  Neurological:     Mental Status: She is alert and oriented to person, place, and time.  Psychiatric:        Behavior: Behavior normal.      Musculoskeletal Exam: C-spine, thoracic spine, lumbar spine good range of motion.  No midline spinal tenderness.  No SI joint tenderness.  Shoulder joints and elbow joints have good range of motion.  She has good range  of motion of bilateral wrist joints.  She has tenderness and inflammation of the left wrist.  She has synovial thickening of MCP joints.  She has tenderness and synovitis of the right second MCP.  Hip joints, knee joints, ankle joints, MTPs, PIPs, DIPs good range of motion with no synovitis.  No warmth or effusion bilateral knee joints.  She has synovial thickening of the left ankle joint.  CDAI Exam: CDAI Score: - Patient Global: -; Provider Global: - Swollen: 2 ; Tender: 2  Joint Exam      Right  Left  Wrist     Swollen Tender  MCP 2  Swollen Tender        Investigation: No additional findings.  Imaging: No results found.  Recent Labs: Lab Results  Component Value Date   WBC 5.9 11/16/2018   HGB 13.8 11/16/2018   PLT 345 11/16/2018   NA 136 11/16/2018   K 4.3 11/16/2018   CL  100 11/16/2018   CO2 29 11/16/2018   GLUCOSE 89 11/16/2018   BUN 12 11/16/2018   CREATININE 0.70 11/16/2018   BILITOT 0.5 11/16/2018   ALKPHOS 61 10/13/2016   AST 18 11/16/2018   ALT 24 11/16/2018   PROT 6.4 11/16/2018   ALBUMIN 3.7 10/13/2016   CALCIUM 9.4 11/16/2018   GFRAA 109 11/16/2018   QFTBGOLDPLUS NEGATIVE 12/22/2017    Speciality Comments: 06/27/14 DR. DEV ONLY -KBD  pt states she will not use Enbrel again/ causes Bronchitis,    Procedures:  Medium Joint Inj: L intercarpal on 04/11/2019 3:42 PM Indications: pain and joint swelling Details: 27 G 1.5 in needle, ultrasound-guided dorsal approach Medications: 1 mL lidocaine 1 %; 20 mg triamcinolone acetonide 40 MG/ML Aspirate: 0 mL Outcome: tolerated well, no immediate complications Procedure, treatment alternatives, risks and benefits explained, specific risks discussed. Consent was given by the patient. Immediately prior to procedure a time out was called to verify the correct patient, procedure, equipment, support staff and site/side marked as required. Patient was prepped and draped in the usual sterile fashion.   Hand/UE Inj: R  index MCP for osteoarthritis on 04/11/2019 3:43 PM Indications: pain and joint swelling Details: 27 G needle, ultrasound-guided dorsal approach Medications: 0.3 mL lidocaine 1 %; 10 mg triamcinolone acetonide 40 MG/ML Aspirate: 0.3 mL Outcome: tolerated well, no immediate complications Procedure, treatment alternatives, risks and benefits explained, specific risks discussed. Consent was given by the patient. Immediately prior to procedure a time out was called to verify the correct patient, procedure, equipment, support staff and site/side marked as required. Patient was prepped and draped in the usual sterile fashion.     Allergies: Enbrel [etanercept] and Sulfasalazine    Assessment / Plan:     Visit Diagnoses: Rheumatoid arthritis of multiple sites with positive rheumatoid factor ([place]): She has tenderness and synovitis of the left wrist and right second MCP joint on exam.  She has been experiencing left wrist pain and inflammation for the past 5 days which has progressively been getting getting worse.  She experienced nocturnal pain in the left wrist last night.  She took meloxicam yesterday and today but is unsure of the dose.  She has also been using CBD oil on a daily basis.  She discontinued methotrexate tablets about 2 months ago due to not noticing any improvement.  She does not want to restart on methotrexate at this time.  She does not want to be on any immunosuppressive agents due to working in [employer] and being concerned about the COVID-19 pandemic.  She requested a prednisone taper which will be sent to the pharmacy today.  A left intercarpal and right second MCP joint cortisone injection will be performed today under ultrasound.  She tolerated the procedure well.  Procedure notes were completed above.  She was advised to notify us if she develops increased joint pain or joint swelling.  She will follow-up in the office in 5 months.  High risk medication use -She is not taking any  immunosuppressive agents at this time.  She discontinued MTX 2 months ago due to an inadequate response according to the patient.    Primary osteoarthritis of both hands: She has PIP and DIP synovial thickening consistent with osteoarthritis of both hands.  Joint protection and muscle strengthening were discussed.   Pain in left wrist: She presents today with left wrist pain and synovitis that started 5 days ago.  She requested a left wrist cortisone injection.  She tolerated the  ultrasound guided procedure.    Pain in right finger: She has tenderness and synovitis of the right 2nd MCP joint.  She requested a cortisone injection today.   Other medical conditions are listed as follows:   History of corneal transplant  History of hypertension  History of depression  Orders: Orders Placed This Encounter  Procedures  . Hand/UE Inj  . Medium Joint Inj  . Hand/UE Inj   Meds ordered this encounter  Medications  . predniSONE (DELTASONE) 5 MG tablet    Sig: Take 4 tablets by mouth daily x2 days, 3 tablets by mouth daily x2 days, 2 tablets by mouth daily x2 days, 1 tablet by mouth daily x2 days    Dispense:  20 tablet    Refill:  0    Face-to-face time spent with patient was 30  minutes. Greater than 50% of time was spent in counseling and coordination of care.  Follow-Up Instructions: Return in about 5 months (around 09/11/2019) for Rheumatoid arthritis, Osteoarthritis.   Ofilia Neas, PA-C   I examined and evaluated the patient with Hazel Sams PA.  Patient continues to have active synovitis in her joints as described above.  I detailed discussion with patient regarding different treatment options.  She is very hesitant to take any DMARDs or Biologics especially during the time of pandemic.  She requested a small prednisone taper which we sent.  I also injected her left wrist joint and right second MCP joint as described above.  The plan of care was discussed as noted above.  Bo Merino, MD  Note - This record has been created using Editor, commissioning.  Chart creation errors have been sought, but may not always  have been located. Such creation errors do not reflect on  the standard of medical care.

## 2019-04-11 NOTE — Telephone Encounter (Signed)
Spoke with patient and advised she has not been seen in the office since February 2020. Her last visit was a telemedicine visit in April 2020. Patient advised will need a follow up visit for evaluation. Patient has been scheduled for 04/12/19 at 3:00 pm.

## 2019-04-11 NOTE — Telephone Encounter (Signed)
Patient called stating she is experiencing pain in her hands and requesting prescription of Prednisone be sent to [facility] on [address].  Patient states she has discontinued taking the Methotrexate and doesn't wish to "experiment with other medications at this time."  Patient was offered an in-person and/or virtual appointment which she declined.  Patient is requesting the prescription be sent to the pharmacy this morning "since she is in pain."

## 2019-08-04 ENCOUNTER — Other Ambulatory Visit: Payer: Self-pay | Admitting: Orthopaedic Surgery

## 2019-08-04 MED ORDER — PREDNISONE 10 MG (21) PO TBPK: ORAL_TABLET | ORAL | 0 refills | Status: DC

## 2019-12-07 ENCOUNTER — Other Ambulatory Visit: Payer: Self-pay | Admitting: Family Medicine

## 2019-12-07 DIAGNOSIS — Z1231 Encounter for screening mammogram for malignant neoplasm of breast: Secondary | ICD-10-CM

## 2019-12-21 ENCOUNTER — Other Ambulatory Visit: Payer: Self-pay

## 2019-12-21 ENCOUNTER — Ambulatory Visit
Admission: RE | Admit: 2019-12-21 | Discharge: 2019-12-21 | Disposition: A | Discharge status: Home / Self Care | Payer: BC Managed Care – PPO | Source: Ambulatory Visit | Attending: Family Medicine | Admitting: Family Medicine

## 2019-12-21 DIAGNOSIS — Z1231 Encounter for screening mammogram for malignant neoplasm of breast: Secondary | ICD-10-CM

## 2019-12-25 ENCOUNTER — Other Ambulatory Visit: Payer: Self-pay | Admitting: Family Medicine

## 2019-12-25 DIAGNOSIS — R928 Other abnormal and inconclusive findings on diagnostic imaging of breast: Secondary | ICD-10-CM

## 2020-02-15 ENCOUNTER — Ambulatory Visit
Admission: RE | Admit: 2020-02-15 | Discharge: 2020-02-15 | Disposition: A | Discharge status: Home / Self Care | Payer: BC Managed Care – PPO | Source: Ambulatory Visit | Attending: Family Medicine | Admitting: Family Medicine

## 2020-02-15 ENCOUNTER — Other Ambulatory Visit: Payer: Self-pay

## 2020-02-15 DIAGNOSIS — R928 Other abnormal and inconclusive findings on diagnostic imaging of breast: Secondary | ICD-10-CM

## 2020-06-07 ENCOUNTER — Other Ambulatory Visit: Payer: Self-pay

## 2020-06-07 MED ORDER — PREDNISONE 5 MG PO TABS: ORAL_TABLET | ORAL | 0 refills | Status: DC

## 2020-06-07 NOTE — Telephone Encounter (Signed)
Patient called stating she is having pain in her bilateral hands and requesting prescription of Prednisone to be sent to [facility] at [address].  Patient is also requesting a return call to let her know it has been sent to the pharmacy.

## 2020-06-07 NOTE — Telephone Encounter (Signed)
Ok to send in prednisone taper starting at 20 mg tapering by 5 mg every 4 days.

## 2020-10-03 ENCOUNTER — Other Ambulatory Visit: Payer: Self-pay

## 2020-10-03 MED ORDER — PREDNISONE 5 MG PO TABS: ORAL_TABLET | ORAL | 0 refills | Status: DC

## 2020-10-03 NOTE — Telephone Encounter (Signed)
Patient called stating she is having pain and swelling in her right hand index finger.  Patient states Dr. Estanislado Pandy was able to drain the fluid from her finger a couple of years ago and it relieved the pressure and pain for 6 months.  Patient scheduled an appointment with Dr. Estanislado Pandy on Tuesday, 10/29/20.  Patient is requesting a prescription of Prednisone to be sent to [facility] [place] to help until she is able to be seen.

## 2020-10-03 NOTE — Telephone Encounter (Signed)
Ok to send in prednisone taper starting at 20 mg tapering by 5 mg every 4 days.

## 2020-10-03 NOTE — Telephone Encounter (Signed)
Patient advised prescription sent to the pharmacy for Prednisone.

## 2020-10-15 NOTE — Progress Notes (Addendum)
Office Visit Note  Patient: Jamie Mccall             Date of Birth: 1956/01/30           MRN: [id number]             PCP: Aletha Halim., PA-C Referring: Aletha Halim., PA-C Visit Date: 10/29/2020 Occupation: @GUAROCC @  Subjective:  Other (Bilateral hand pain )   History of Present Illness: Jamie Mccall is a 65 y.o. female history of seropositive rheumatoid arthritis.  Jamie Mccall has been off DMARDs and Biologics for a long time due to frequent infections.  Jamie Mccall has been having frequent flares this year.  Jamie Mccall has had 2 courses of prednisone taper and this year.  The last taper was given about 10 days ago.  We discussed different treatment options over the phone.  Jamie Mccall states Jamie Mccall is talked to her ophthalmologist and they are okay for her to take her hydroxychloroquine.  Jamie Mccall had a good response to the prednisone taper.  Jamie Mccall is not having any discomfort in her joints currently.  Activities of Daily Living:  Patient reports morning stiffness for 0 minutes.    Patient Denies nocturnal pain.  Difficulty dressing/grooming: Denies Difficulty climbing stairs: Denies Difficulty getting out of chair: Denies Difficulty using hands for taps, buttons, cutlery, and/or writing: Reports  Review of Systems  Constitutional: Negative for fatigue.  HENT: Negative for mouth sores, mouth dryness and nose dryness.   Eyes: Negative for pain, itching and dryness.  Respiratory: Negative for shortness of breath and difficulty breathing.   Cardiovascular: Negative for chest pain and palpitations.  Gastrointestinal: Negative for blood in stool, constipation and diarrhea.  Endocrine: Negative for increased urination.  Genitourinary: Negative for difficulty urinating.  Musculoskeletal: Positive for arthralgias, joint pain and joint swelling. Negative for myalgias, morning stiffness, muscle tenderness and myalgias.  Skin: Negative for color change, rash and redness.  Allergic/Immunologic: Negative for  susceptible to infections.  Neurological: Negative for dizziness, numbness, headaches, memory loss and weakness.  Hematological: Positive for bruising/bleeding tendency.  Psychiatric/Behavioral: Negative for confusion.    PMFS History:  Patient Active Problem List   Diagnosis Date Noted  . Bilateral hand pain 04/30/2017  . Mass 04/30/2017  . High risk medication use 10/13/2016  . Degenerative joint disease involving multiple joints 10/13/2016  . History of hypertension 10/13/2016  . History of depression 10/13/2016  . Morning joint stiffness 10/13/2016  . Glaucoma suspect, bilateral 02/16/2014  . Primary open angle glaucoma (POAG) 01/18/2014  . Punctal stenosis, acquired, bilateral 01/18/2014  . Epiphora 01/05/2014  . Pseudophakia 09/06/2013  . 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 Ptosis 07/31/2013  . PCO (posterior capsular opacification) 03/13/2013  . History of corneal transplant 08/12/2012  . 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[place]) 05/04/2012  . 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 Arthritis    RA    Family History  Problem Relation Age of Onset  . Hypertension Father   . Cancer Mother   . Hypertension Mother    Past Surgical History:  Procedure Laterality Date  . EXCISION MASS UPPER EXTREMETIES Left 06/15/2017   Procedure: EXCISION MASS LEFT INDEX FINGER;  Surgeon: Daryll Brod, MD;  Location: [facility];  Service: Orthopedics;  Laterality: Left;  . EYE SURGERY     rt corneal transplants  . FOOT SURGERY Left 2018   cyst removed   . HAND SURGERY  Right   . HAND SURGERY Left 2019   Dr. Fredna Dow   . MASS EXCISION Left 11/05/2016   Procedure: Excision of left foot mass;  Surgeon: Wylene Simmer, MD;  Location: [facility];  Service: Orthopedics;  Laterality: Left;  . SQUAMOUS CELL CARCINOMA EXCISION  08/18/2018   right eye   Social History    Social History Narrative  . Not on file   Immunization History  Administered Date(s) Administered  . Influenza,inj,Quad PF,6+ Mos 08/18/2017, 08/18/2017     Objective: Vital Signs: BP (!) 145/79 (BP Location: Left Arm, Patient Position: Sitting, Cuff Size: Large)   Pulse 80   Resp 15   Ht 5' 4.5" (1.638 m)   Wt 214 lb (97.1 kg)   BMI 36.17 kg/m    Physical Exam Vitals and nursing note reviewed.  Constitutional:      Appearance: Jamie Mccall is well-developed.  HENT:     Head: Normocephalic and atraumatic.  Eyes:     Conjunctiva/sclera: Conjunctivae normal.  Cardiovascular:     Rate and Rhythm: Normal rate and regular rhythm.     Heart sounds: Normal heart sounds.  Pulmonary:     Effort: Pulmonary effort is normal.     Breath sounds: Normal breath sounds.  Abdominal:     General: Bowel sounds are normal.     Palpations: Abdomen is soft.  Musculoskeletal:     Cervical back: Normal range of motion.  Lymphadenopathy:     Cervical: No cervical adenopathy.  Skin:    General: Skin is warm and dry.     Capillary Refill: Capillary refill takes less than 2 seconds.  Neurological:     Mental Status: Jamie Mccall is alert and oriented to person, place, and time.  Psychiatric:        Behavior: Behavior normal.      Musculoskeletal Exam: C-spine was in good range of motion.  Shoulder joints, elbow joints, wrist joints, MCPs PIPs and DIPs with good range of motion.  Jamie Mccall has synovitis over right second MCP joint.  Hip joints and knee joints in good range of motion.  Jamie Mccall had thickening of bilateral ankle joints.  There was no tenderness across MTPs  CDAI Exam: CDAI Score: 2.7  Patient Global: 3 mm; Provider Global: 4 mm Swollen: 1 ; Tender: 1  Joint Exam 10/29/2020      Right  Left  MCP 2  Swollen Tender        Investigation: No additional findings.  Imaging: US Guided Needle Placement  Result Date: 10/29/2020 Ultrasound guided injection is preferred based studies that show  increased duration, increased effect, greater accuracy, decreased procedural pain, increased response rate, and decreased cost with ultrasound guided versus blind injection.   Verbal informed consent obtained.  Time-out conducted.  Noted no overlying erythema, induration, or other signs of local infection. Ultrasound-guided right second MCP injection: After sterile prep with Betadine, injected 0.5 mL of 1% lidocaine and milligrams Kenalog using a 27-gauge needle, needle was passed into the MCP joint.    Recent Labs: Lab Results  Component Value Date   WBC 5.9 11/16/2018   HGB 13.8 11/16/2018   PLT 345 11/16/2018   NA 136 11/16/2018   K 4.3 11/16/2018   CL 100 11/16/2018   CO2 29 11/16/2018   GLUCOSE 89 11/16/2018   BUN 12 11/16/2018   CREATININE 0.70 11/16/2018   BILITOT 0.5 11/16/2018   ALKPHOS 61 10/13/2016   AST 18 11/16/2018   ALT 24 11/16/2018  PROT 6.4 11/16/2018   ALBUMIN 3.7 10/13/2016   CALCIUM 9.4 11/16/2018   GFRAA 109 11/16/2018   QFTBGOLDPLUS NEGATIVE 12/22/2017    Speciality Comments: 06/27/14 DR. DEV ONLY -KBD  pt states Jamie Mccall will not use Enbrel again/ causes Bronchitis,    Procedures:  No procedures performed Allergies: Enbrel [etanercept] and Sulfasalazine   Assessment / Plan:     Visit Diagnoses: Rheumatoid arthritis of multiple sites with positive rheumatoid factor (HCC) -positive RF, positive anti-CCP, +14 3 3  eta .Jamie Mccall has been having increased pain and discomfort in her hands.  Jamie Mccall states her right second MCP joint is the only joint which has been swollen.  Jamie Mccall had good response to the prednisone taper.  Jamie Mccall requested right second MCP joint cortisone injection.  Jamie Mccall has tried methotrexate in the past and discontinued due to inadequate response.  Jamie Mccall does not want any Biologics due to increased risk of infection.  Jamie Mccall states even with the methotrexate Jamie Mccall gets increased infections.  We detailed discussion regarding different treatment options.  Jamie Mccall states Jamie Mccall  had discussed with her ophthalmologist and they felt that it is safe for her to go on hydroxychloroquine.  Indications side effects contraindications of hydroxychloroquine were discussed.  We will obtain labs today.  If labs are normal I will call in prescription for hydroxychloroquine 200 mg p.o. twice daily Monday to Friday.  We will check labs in a month and then every 3 months to monitor for drug toxicity.  Jamie Mccall has been advised to get baseline examination and then eye examination on 6 monthly basis to monitor for ocular toxicity.  Plan: Sedimentation rate, Rheumatoid factor, Cyclic citrul peptide antibody, IgG, 14-3-3 eta Protein  Patient was counseled on the purpose, proper use, and adverse effects of hydroxychloroquine including nausea/diarrhea, skin rash, headaches, and sun sensitivity.  Discussed importance of annual eye exams while on hydroxychloroquine to monitor to ocular toxicity and discussed importance of frequent laboratory monitoring.  Provided patient with eye exam form for baseline ophthalmologic exam.  Provided patient with educational materials on hydroxychloroquine and answered all questions.  Patient consented to hydroxychloroquine.  Will upload consent in the media tab.    Dose will be Plaquenil 200 mg twice daily Monday through Friday.  Prescription pending lab results.   High risk medication use - Jamie Mccall is not taking any immunosuppressive agents at this time.  Jamie Mccall discontinued MTX  due to an inadequate response according to the patient.   - Plan: CBC with Differential/Platelet, COMPLETE METABOLIC PANEL WITH GFR  Pain in left wrist - injected 04/11/2019.  Patient had no synovitis today.  Pain in right finger(s)-Jamie Mccall has synovitis on palpation of her right second MCP joint.  Jamie Mccall requests cortisone injection to the right second MCP joint.  Jamie Mccall states Jamie Mccall had very good response in the past.  Per her request right second MCP joint was injected with cortisone as described above.  Jamie Mccall  tolerated the procedure well.  Postprocedure instructions were given.  Ultrasound guided injection is preferred based studies that show increased duration, increased effect, greater accuracy, decreased procedural pain, increased response rate, and decreased cost with ultrasound guided versus blind injection.   Verbal informed consent obtained.  Time-out conducted.  Noted no overlying erythema, induration, or other signs of local infection. Ultrasound-guided right second MCP injection: After sterile prep with Betadine, injected 0.5 mL of 1% lidocaine and milligrams Kenalog using a 27-gauge needle, needle was passed into the MCP joint.   Primary osteoarthritis of both hands-Jamie Mccall is osteoarthritis  in her both hands which causes discomfort.  History of corneal transplant-followed by ophthalmologist.  History of hypertension-blood pressure is mildly elevated today.  History of depression  Other fatigue -patient wants to have vitamin D level checked because Jamie Mccall has been experiencing fatigue.  Plan: VITAMIN D 25 Hydroxy (Vit-D Deficiency, Fractures)  Postmenopausal  Osteoporosis screening-I do not have a DEXA scan in the system.  Patient does not want to have DEXA scan at this time.  Orders: Orders Placed This Encounter  Procedures  . US Guided Needle Placement  . CBC with Differential/Platelet  . COMPLETE METABOLIC PANEL WITH GFR  . Sedimentation rate  . VITAMIN D 25 Hydroxy (Vit-D Deficiency, Fractures)  . Rheumatoid factor  . Cyclic citrul peptide antibody, IgG  . 14-3-3 eta Protein   No orders of the defined types were placed in this encounter.     Follow-Up Instructions: Return in about 3 months (around 01/29/2021) for Rheumatoid arthritis.   Bo Merino, MD  Note - This record has been created using Editor, commissioning.  Chart creation errors have been sought, but may not always  have been located. Such creation errors do not reflect on  the standard of medical care.

## 2020-10-19 ENCOUNTER — Telehealth: Payer: Self-pay | Admitting: Rheumatology

## 2020-10-19 ENCOUNTER — Other Ambulatory Visit: Payer: Self-pay | Admitting: Rheumatology

## 2020-10-19 MED ORDER — PREDNISONE 5 MG PO TABS: ORAL_TABLET | ORAL | 0 refills | Status: DC

## 2020-10-19 NOTE — Progress Notes (Signed)
I received a phone call from the patient.  She has not been seen in the office since September 2020.  She states that she has been  hesitant to go on any DMARDs or Biologics during the pandemic.  She had a flare few weeks ago and was given a prednisone taper.  She states that as soon as she finished the prednisone taper the pain and swelling returned.  She is having difficulty doing routine activities and making a fist.  She has an appointment coming up on October 29, 2020.  She requested another prednisone taper in the meantime.  Side effects of prednisone were discussed at length.  She will be starting prednisone at 20 mg p.o. daily and taper by 5 mg every 4 days.  Prescription was sent to the pharmacy. Bo Merino, MD

## 2020-10-20 NOTE — Telephone Encounter (Signed)
Opened in error

## 2020-10-29 ENCOUNTER — Encounter: Payer: Self-pay | Admitting: Rheumatology

## 2020-10-29 ENCOUNTER — Telehealth: Payer: Self-pay

## 2020-10-29 ENCOUNTER — Ambulatory Visit (INDEPENDENT_AMBULATORY_CARE_PROVIDER_SITE_OTHER): Payer: BC Managed Care – PPO | Admitting: Rheumatology

## 2020-10-29 ENCOUNTER — Other Ambulatory Visit: Payer: Self-pay

## 2020-10-29 ENCOUNTER — Ambulatory Visit: Payer: Self-pay

## 2020-10-29 VITALS — BP 145/79 | HR 80 | Resp 15 | Ht 64.5 in | Wt 214.0 lb

## 2020-10-29 DIAGNOSIS — Z947 Corneal transplant status: Secondary | ICD-10-CM

## 2020-10-29 DIAGNOSIS — M25532 Pain in left wrist: Secondary | ICD-10-CM | POA: Diagnosis not present

## 2020-10-29 DIAGNOSIS — Z780 Asymptomatic menopausal state: Secondary | ICD-10-CM

## 2020-10-29 DIAGNOSIS — Z8679 Personal history of other diseases of the circulatory system: Secondary | ICD-10-CM

## 2020-10-29 DIAGNOSIS — Z13820 Encounter for screening for osteoporosis: Secondary | ICD-10-CM

## 2020-10-29 DIAGNOSIS — R5383 Other fatigue: Secondary | ICD-10-CM

## 2020-10-29 DIAGNOSIS — M79644 Pain in right finger(s): Secondary | ICD-10-CM

## 2020-10-29 DIAGNOSIS — Z8659 Personal history of other mental and behavioral disorders: Secondary | ICD-10-CM

## 2020-10-29 DIAGNOSIS — M19041 Primary osteoarthritis, right hand: Secondary | ICD-10-CM

## 2020-10-29 DIAGNOSIS — M0609 Rheumatoid arthritis without rheumatoid factor, multiple sites: Secondary | ICD-10-CM | POA: Diagnosis not present

## 2020-10-29 DIAGNOSIS — Z79899 Other long term (current) drug therapy: Secondary | ICD-10-CM

## 2020-10-29 DIAGNOSIS — M0579 Rheumatoid arthritis with rheumatoid factor of multiple sites without organ or systems involvement: Secondary | ICD-10-CM

## 2020-10-29 DIAGNOSIS — M19042 Primary osteoarthritis, left hand: Secondary | ICD-10-CM

## 2020-10-29 NOTE — Telephone Encounter (Signed)
Pending lab results, patient will be starting plaquenil per Dr. Estanislado Pandy.   Consent obtained and sent to the scan center.   Thanks!

## 2020-10-29 NOTE — Patient Instructions (Addendum)
Hydroxychloroquine tablets What is this medicine? HYDROXYCHLOROQUINE (hye drox ee KLOR oh kwin) is used to treat rheumatoid arthritis and systemic lupus erythematosus. It is also used to treat malaria. This medicine may be used for other purposes; ask your health care provider or pharmacist if you have questions. COMMON BRAND NAME(S): Plaquenil, Quineprox What should I tell my health care provider before I take this medicine? They need to know if you have any of these conditions:  diabetes  eye disease, vision problems  G6PD deficiency  heart disease  history of irregular heartbeat  if you often drink alcohol  kidney disease  liver disease  porphyria  psoriasis  an unusual or allergic reaction to chloroquine, hydroxychloroquine, other medicines, foods, dyes, or preservatives  pregnant or trying to get pregnant  breast-feeding How should I use this medicine? Take this medicine by mouth with a glass of water. Take it as directed on the prescription label. Do not cut, crush or chew this medicine. Swallow the tablets whole. Take it with food. Do not take it more than directed. Take all of this medicine unless your health care provider tells you to stop it early. Keep taking it even if you think you are better. Take products with antacids in them at a different time of day than this medicine. Take this medicine 4 hours before or 4 hours after antacids. Talk to your health care provider if you have questions. Talk to your pediatrician regarding the use of this medicine in children. While this drug may be prescribed for selected conditions, precautions do apply. Overdosage: If you think you have taken too much of this medicine contact a poison control center or emergency room at once. NOTE: This medicine is only for you. Do not share this medicine with others. What if I miss a dose? If you miss a dose, take it as soon as you can. If it is almost time for your next dose, take only  that dose. Do not take double or extra doses. What may interact with this medicine? Do not take this medicine with any of the following medications:  cisapride  dronedarone  pimozide  thioridazine This medicine may also interact with the following medications:  ampicillin  antacids  cimetidine  cyclosporine  digoxin  kaolin  medicines for diabetes, like insulin, glipizide, glyburide  medicines for seizures like carbamazepine, phenobarbital, phenytoin  mefloquine  methotrexate  other medicines that prolong the QT interval (cause an abnormal heart rhythm)  praziquantel This list may not describe all possible interactions. Give your health care provider a list of all the medicines, herbs, non-prescription drugs, or dietary supplements you use. Also tell them if you smoke, drink alcohol, or use illegal drugs. Some items may interact with your medicine. What should I watch for while using this medicine? Visit your health care provider for regular checks on your progress. Tell your health care provider if your symptoms do not start to get better or if they get worse. You may need blood work done while you are taking this medicine. If you take other medicines that can affect heart rhythm, you may need more testing. Talk to your health care provider if you have questions. Your vision may be tested before and during use of this medicine. Tell your health care provider right away if you have any change in your eyesight. This medicine may cause serious skin reactions. They can happen weeks to months after starting the medicine. Contact your health care provider right away if you  notice fevers or flu-like symptoms with a rash. The rash may be red or purple and then turn into blisters or peeling of the skin. Or, you might notice a red rash with swelling of the face, lips or lymph nodes in your neck or under your arms. If you or your family notice any changes in your behavior, such as new  or worsening depression, thoughts of harming yourself, anxiety, or other unusual or disturbing thoughts, or memory loss, call your health care provider right away. What side effects may I notice from receiving this medicine? Side effects that you should report to your doctor or health care professional as soon as possible:  allergic reactions (skin rash, itching or hives; swelling of the face, lips, or tongue)  changes in vision  decreased hearing, ringing in the ears  heartbeat rhythm changes (trouble breathing; chest pain; dizziness; fast, irregular heartbeat; feeling faint or lightheaded, falls)  liver injury (dark yellow or brown urine; general ill feeling or flu-like symptoms; loss of appetite, right upper belly pain; unusually weak or tired, yellowing of the eyes or skin)  low blood sugar (feeling anxious; confusion; dizziness; increased hunger; unusually weak or tired; increased sweating; shakiness; cold, clammy skin; irritable; headache; blurred vision; fast heartbeat; loss of consciousness)  low red blood cell counts (trouble breathing; feeling faint; lightheaded, falls; unusually weak or tired)  muscle weakness  pain, tingling, numbness in the hands or feet  rash, fever, and swollen lymph nodes  redness, blistering, peeling or loosening of the skin, including inside the mouth  suicidal thoughts, mood changes  uncontrollable head, mouth, neck, arm, or leg movements  unusual bruising or bleeding Side effects that usually do not require medical attention (report to your doctor or health care professional if they continue or are bothersome):  diarrhea  hair loss  irritable This list may not describe all possible side effects. Call your doctor for medical advice about side effects. You may report side effects to FDA at 1-800-FDA-1088. Where should I keep my medicine? Keep out of the reach of children and pets. Store at room temperature up to 30 degrees C (86 degrees F).  Protect from light. Get rid of any unused medicine after the expiration date. To get rid of medicines that are no longer needed or have expired:  Take the medicine to a medicine take-back program. Check with your pharmacy or law enforcement to find a location.  If you cannot return the medicine, check the label or package insert to see if the medicine should be thrown out in the garbage or flushed down the toilet. If you are not sure, ask your health care provider. If it is safe to put it in the trash, empty the medicine out of the container. Mix the medicine with cat litter, dirt, coffee grounds, or other unwanted substance. Seal the mixture in a bag or container. Put it in the trash. NOTE: This sheet is a summary. It may not cover all possible information. If you have questions about this medicine, talk to your doctor, pharmacist, or health care provider.  2021 Elsevier/Gold Standard (2020-01-15 15:07:49)  Standing Labs We placed an order today for your standing lab work.   Please have your standing labs drawn in 1 month, every 3 months  If possible, please have your labs drawn 2 weeks prior to your appointment so that the provider can discuss your results at your appointment.  We have open lab daily Monday through Thursday from 1:30-4:30 PM and Friday from  1:30-4:00 PM at the office of Dr. Bo Merino, [facility].   Please be advised, all patients with office appointments requiring lab work will take precedents over walk-in lab work.  If possible, please come for your lab work on Monday and Friday afternoons, as you may experience shorter wait times. The office is located at [address] [place], [place], [place] [address] No appointment is necessary.   Labs are drawn by [facility]. Please bring your co-pay at the time of your lab draw.  You may receive a bill from [facility] for your lab work.  If you wish to have your labs drawn at another location, please call the office  24 hours in advance to send orders.  If you have any questions regarding directions or hours of operation,  please call [phone].   As a reminder, please drink plenty of water prior to coming for your lab work. Thanks!

## 2020-11-07 NOTE — Telephone Encounter (Signed)
14-3-3 eta protein is showing "active- in process". Butch Penny Counselling psychologist) called to check the status of lab result. The lab should be resulted by tomorrow.

## 2020-11-09 LAB — CBC WITH DIFFERENTIAL/PLATELET
Absolute Monocytes: 584 cells/uL (ref 200–950)
Basophils Absolute: 64 cells/uL (ref 0–200)
Basophils Relative: 0.8 %
Eosinophils Absolute: 208 cells/uL (ref 15–500)
Eosinophils Relative: 2.6 %
HCT: 41.4 % (ref 35.0–45.0)
Hemoglobin: 14.3 g/dL (ref 11.7–15.5)
Lymphs Abs: 2064 cells/uL (ref 850–3900)
MCH: 33.4 pg — ABNORMAL HIGH (ref 27.0–33.0)
MCHC: 34.5 g/dL (ref 32.0–36.0)
MCV: 96.7 fL (ref 80.0–100.0)
MPV: 9.3 fL (ref 7.5–12.5)
Monocytes Relative: 7.3 %
Neutro Abs: 5080 cells/uL (ref 1500–7800)
Neutrophils Relative %: 63.5 %
Platelets: 283 10*3/uL (ref 140–400)
RBC: 4.28 10*6/uL (ref 3.80–5.10)
RDW: 12.2 % (ref 11.0–15.0)
Total Lymphocyte: 25.8 %
WBC: 8 10*3/uL (ref 3.8–10.8)

## 2020-11-09 LAB — 14-3-3 ETA PROTEIN: 14-3-3 eta Protein: 0.7 ng/mL — ABNORMAL HIGH (ref <0.2)

## 2020-11-09 LAB — COMPLETE METABOLIC PANEL WITH GFR
AG Ratio: 1.6 (calc) (ref 1.0–2.5)
ALT: 24 U/L (ref 6–29)
AST: 18 U/L (ref 10–35)
Albumin: 3.9 g/dL (ref 3.6–5.1)
Alkaline phosphatase (APISO): 63 U/L (ref 37–153)
BUN: 13 mg/dL (ref 7–25)
CO2: 32 mmol/L (ref 20–32)
Calcium: 9.1 mg/dL (ref 8.6–10.4)
Chloride: 98 mmol/L (ref 98–110)
Creat: 0.59 mg/dL (ref 0.50–0.99)
GFR, Est African American: 114 mL/min/{1.73_m2} (ref >=60)
GFR, Est Non African American: 98 mL/min/{1.73_m2} (ref >=60)
Globulin: 2.4 g/dL (calc) (ref 1.9–3.7)
Glucose, Bld: 82 mg/dL (ref 65–99)
Potassium: 4.6 mmol/L (ref 3.5–5.3)
Sodium: 135 mmol/L (ref 135–146)
Total Bilirubin: 0.7 mg/dL (ref 0.2–1.2)
Total Protein: 6.3 g/dL (ref 6.1–8.1)

## 2020-11-09 LAB — CYCLIC CITRUL PEPTIDE ANTIBODY, IGG: Cyclic Citrullin Peptide Ab: >250 UNITS — ABNORMAL HIGH

## 2020-11-09 LAB — SEDIMENTATION RATE: Sed Rate: 2 mm/h (ref 0–30)

## 2020-11-09 LAB — RHEUMATOID FACTOR: Rheumatoid fact SerPl-aCnc: 516 IU/mL — ABNORMAL HIGH (ref <14)

## 2020-11-09 LAB — VITAMIN D 25 HYDROXY (VIT D DEFICIENCY, FRACTURES): Vit D, 25-Hydroxy: 42 ng/mL (ref 30–100)

## 2020-11-10 NOTE — Progress Notes (Signed)
CBC, CMP, ESR are normal. All the three test for rheumatoid arthritis are positive.Vitamin D is in desirable range.

## 2020-11-11 ENCOUNTER — Other Ambulatory Visit: Payer: Self-pay | Admitting: *Deleted

## 2020-11-11 MED ORDER — HYDROXYCHLOROQUINE SULFATE 200 MG PO TABS: ORAL_TABLET | ORAL | 0 refills | Status: DC

## 2020-11-12 NOTE — Progress Notes (Signed)
LMAM for patient to call back.

## 2020-11-13 NOTE — Progress Notes (Signed)
I called patient and discussed all the lab results with her.

## 2020-12-19 NOTE — Progress Notes (Signed)
Office Visit Note  Patient: Jamie Mccall             Date of Birth: 01-11-1956           MRN: [id number]             PCP: Aletha Halim., PA-C Referring: Aletha Halim., PA-C Visit Date: 12/24/2020 Occupation: @GUAROCC @  Subjective:  Right knee pain.   History of Present Illness: Jamie Mccall is a 65 y.o. female with a history of seropositive rheumatoid arthritis and osteoarthritis.  She restarted Plaquenil on November 11, 2020.  She states gradually the symptoms have improved and she has been doing well.  Over the last 2 weeks she believes due to increased activity she has been having pain in her right knee joint and left second MCP joint.  She is having difficulty walking due to right knee pain.  She would like to have a cortisone injection.  Activities of Daily Living:  Patient reports morning stiffness for 5-10 minutes.   Patient Denies nocturnal pain.  Difficulty dressing/grooming: Denies Difficulty climbing stairs: Denies Difficulty getting out of chair: Denies Difficulty using hands for taps, buttons, cutlery, and/or writing: Denies  Review of Systems  Constitutional: Negative for fatigue.  HENT: Negative for mouth dryness and nose dryness.   Eyes: Negative for visual disturbance and dryness.  Respiratory: Negative for cough, hemoptysis, shortness of breath and difficulty breathing.   Cardiovascular: Positive for swelling in legs/feet. Negative for chest pain and palpitations.  Gastrointestinal: Negative for abdominal pain, blood in stool, constipation and diarrhea.  Endocrine: Negative for increased urination.  Genitourinary: Negative for painful urination.  Musculoskeletal: Positive for arthralgias, joint pain and morning stiffness. Negative for joint swelling, myalgias, muscle weakness, muscle tenderness and myalgias.  Skin: Negative for color change, rash and redness.  Allergic/Immunologic: Negative for susceptible to infections.  Neurological: Negative  for dizziness, numbness, headaches, memory loss and weakness.  Hematological: Negative for swollen glands.  Psychiatric/Behavioral: Negative for confusion and sleep disturbance.    PMFS History:  Patient Active Problem List   Diagnosis Date Noted  . Bilateral hand pain 04/30/2017  . Mass 04/30/2017  . High risk medication use 10/13/2016  . Degenerative joint disease involving multiple joints 10/13/2016  . History of hypertension 10/13/2016  . History of depression 10/13/2016  . Morning joint stiffness 10/13/2016  . Glaucoma suspect, bilateral 02/16/2014  . Primary open angle glaucoma (POAG) 01/18/2014  . Punctal stenosis, acquired, bilateral 01/18/2014  . Epiphora 01/05/2014  . Pseudophakia 09/06/2013  . 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 Ptosis 07/31/2013  . PCO (posterior capsular opacification) 03/13/2013  . History of corneal transplant 08/12/2012  . 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[place]) 05/04/2012  . 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 Arthritis    RA    Family History  Problem Relation Age of Onset  . Hypertension Father   . Cancer Mother   . Hypertension Mother    Past Surgical History:  Procedure Laterality Date  . EXCISION MASS UPPER EXTREMETIES Left 06/15/2017   Procedure: EXCISION MASS LEFT INDEX FINGER;  Surgeon: Daryll Brod, MD;  Location: [facility];  Service: Orthopedics;  Laterality: Left;  . EYE SURGERY     rt corneal transplants  . FOOT SURGERY Left 2018   cyst removed   . HAND SURGERY Right   . HAND SURGERY Left 2019   Dr. Fredna Dow   .  MASS EXCISION Left 11/05/2016   Procedure: Excision of left foot mass;  Surgeon: Wylene Simmer, MD;  Location: [facility];  Service: Orthopedics;  Laterality: Left;  . 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 Not on file    Immunization History  Administered Date(s) Administered  . Influenza,inj,Quad PF,6+ Mos 08/18/2017, 08/18/2017     Objective: Vital Signs: BP 138/80 (BP Location: Left Arm, Patient Position: Sitting, Cuff Size: Normal)   Pulse 79   Ht 5\' 4"  (1.626 m)   Wt 211 lb 3.2 oz (95.8 kg)   BMI 36.25 kg/m    Physical Exam Vitals and nursing note reviewed.  Constitutional:      Appearance: She is well-developed.  HENT:     Head: Normocephalic and atraumatic.  Eyes:     Conjunctiva/sclera: Conjunctivae normal.  Cardiovascular:     Rate and Rhythm: Normal rate and regular rhythm.     Heart sounds: Normal heart sounds.  Pulmonary:     Effort: Pulmonary effort is normal.     Breath sounds: Normal breath sounds.  Abdominal:     General: Bowel sounds are normal.     Palpations: Abdomen is soft.  Musculoskeletal:     Cervical back: Normal range of motion.  Lymphadenopathy:     Cervical: No cervical adenopathy.  Skin:    General: Skin is warm and dry.     Capillary Refill: Capillary refill takes less than 2 seconds.  Neurological:     Mental Status: She is alert and oriented to person, place, and time.  Psychiatric:        Behavior: Behavior normal.      Musculoskeletal Exam: C-spine was in good range of motion.  Shoulder and elbow joints with good range of motion.  She has MCP thickening and subluxation.  Some synovitis was noted in the left second MCP joint.  Both knee joints with good range of motion.  She has warmth on palpation of her right knee joint.  Bilateral ankle joint thickening and MTP changes were noted consistent with rheumatoid arthritis.  CDAI Exam: CDAI Score: 3.4  Patient Global: 2 mm; Provider Global: 2 mm Swollen: 1 ; Tender: 2  Joint Exam 12/24/2020      Right  Left  MCP 2     Swollen Tender  Knee   Tender        Investigation: No additional findings.  Imaging: No results found.  Recent Labs: Lab Results  Component Value Date   WBC 8.0  10/29/2020   HGB 14.3 10/29/2020   PLT 283 10/29/2020   NA 135 10/29/2020   K 4.6 10/29/2020   CL 98 10/29/2020   CO2 32 10/29/2020   GLUCOSE 82 10/29/2020   BUN 13 10/29/2020   CREATININE 0.59 10/29/2020   BILITOT 0.7 10/29/2020   ALKPHOS 61 10/13/2016   AST 18 10/29/2020   ALT 24 10/29/2020   PROT 6.3 10/29/2020   ALBUMIN 3.7 10/13/2016   CALCIUM 9.1 10/29/2020   GFRAA 114 10/29/2020   QFTBGOLDPLUS NEGATIVE 12/22/2017    Speciality Comments: 06/27/14 DR. DEV ONLY -KBD  pt states she will not use Enbrel again/ causes Bronchitis,    Procedures:  Large Joint Inj: R knee on 12/24/2020 3:34 PM Indications: pain Details: 27 G 1.5 in needle, medial approach  Arthrogram: No  Medications: 40 mg triamcinolone acetonide 40 MG/ML; 1.5 mL lidocaine 1 % Aspirate: 0 mL  Outcome: tolerated well, no immediate complications Procedure, treatment alternatives, risks and benefits explained, specific risks discussed. Consent was given by the patient. Immediately prior to procedure a time out was called to verify the correct patient, procedure, equipment, support staff and site/side marked as required. Patient was prepped and draped in the usual sterile fashion.     Allergies: Enbrel [etanercept] and Sulfasalazine   Assessment / Plan:     Visit Diagnoses: Rheumatoid arthritis involving multiple sites with positive rheumatoid factor (HCC) - positive RF, positive anti-CCP, +14 3 3  eta.  She has severe rheumatoid arthritis involving multiple joints.  She has been off treatment for several years.  She restarted Plaquenil in April 2022.  She does not want to have repeat x-rays to monitor for disease progression.  As she has some inflammation in her left second MCP joint and knee joint we discussed possible use of more aggressive therapy in the future but she is hesitant at this time.  High risk medication use - Started on PLQ at her last visit on 10/29/20.  PLQ 200 mg BID M-F - Plan: CBC with  Differential/Platelet, COMPLETE METABOLIC PANEL WITH GFR today and then every 5 months. Primary osteoarthritis of both hands  Acute pain of right knee -she has been experiencing increased pain in her right knee joint for the last 2 weeks.  She had warmth on palpation.  No effusion was noted.  After informed consent was obtained right knee joint was injected with cortisone as described above.  She tolerated the procedure well.  Postprocedure instructions were given.  Plan: XR KNEE 3 VIEW RIGHT.  X-ray showed moderate osteoarthritis and moderate chondromalacia patella.  History of corneal transplant  History of hypertension-blood pressure is normal today.  History of depression  Other fatigue  Postmenopausal    Orders: Orders Placed This Encounter  Procedures  . XR KNEE 3 VIEW RIGHT  . CBC with Differential/Platelet  . COMPLETE METABOLIC PANEL WITH GFR   No orders of the defined types were placed in this encounter.    Follow-Up Instructions: Return in about 5 months (around 05/26/2021) for Rheumatoid arthritis.   Bo Merino, MD  Note - This record has been created using Editor, commissioning.  Chart creation errors have been sought, but may not always  have been located. Such creation errors do not reflect on  the standard of medical care.

## 2020-12-24 ENCOUNTER — Encounter: Payer: Self-pay | Admitting: Rheumatology

## 2020-12-24 ENCOUNTER — Ambulatory Visit (INDEPENDENT_AMBULATORY_CARE_PROVIDER_SITE_OTHER): Payer: BC Managed Care – PPO | Admitting: Rheumatology

## 2020-12-24 ENCOUNTER — Ambulatory Visit: Payer: Self-pay

## 2020-12-24 ENCOUNTER — Other Ambulatory Visit: Payer: Self-pay

## 2020-12-24 VITALS — BP 138/80 | HR 79 | Ht 64.0 in | Wt 211.2 lb

## 2020-12-24 DIAGNOSIS — Z79899 Other long term (current) drug therapy: Secondary | ICD-10-CM | POA: Diagnosis not present

## 2020-12-24 DIAGNOSIS — M19042 Primary osteoarthritis, left hand: Secondary | ICD-10-CM

## 2020-12-24 DIAGNOSIS — Z780 Asymptomatic menopausal state: Secondary | ICD-10-CM

## 2020-12-24 DIAGNOSIS — R5383 Other fatigue: Secondary | ICD-10-CM

## 2020-12-24 DIAGNOSIS — Z8659 Personal history of other mental and behavioral disorders: Secondary | ICD-10-CM

## 2020-12-24 DIAGNOSIS — Z947 Corneal transplant status: Secondary | ICD-10-CM | POA: Diagnosis not present

## 2020-12-24 DIAGNOSIS — M19041 Primary osteoarthritis, right hand: Secondary | ICD-10-CM | POA: Diagnosis not present

## 2020-12-24 DIAGNOSIS — M0579 Rheumatoid arthritis with rheumatoid factor of multiple sites without organ or systems involvement: Secondary | ICD-10-CM

## 2020-12-24 DIAGNOSIS — M25561 Pain in right knee: Secondary | ICD-10-CM | POA: Diagnosis not present

## 2020-12-24 DIAGNOSIS — Z8679 Personal history of other diseases of the circulatory system: Secondary | ICD-10-CM

## 2020-12-24 MED ORDER — LIDOCAINE HCL 1 % IJ SOLN
1.5000 mL | INTRAMUSCULAR | Status: AC | PRN
Start: 2020-12-24 — End: 2020-12-24
  Administered 2020-12-24: 1.5 mL

## 2020-12-24 MED ORDER — TRIAMCINOLONE ACETONIDE 40 MG/ML IJ SUSP
40.0000 mg | INTRAMUSCULAR | Status: AC | PRN
  Administered 2020-12-24: 40 mg via INTRA_ARTICULAR

## 2020-12-24 NOTE — Patient Instructions (Addendum)
Standing Labs We placed an order today for your standing lab work.   Please have your standing labs drawn in October  If possible, please have your labs drawn 2 weeks prior to your appointment so that the provider can discuss your results at your appointment.  We have open lab daily Monday through Thursday from 1:30-4:30 PM and Friday from 1:30-4:00 PM at the office of Dr. Bo Merino, [facility].   Please be advised, all patients with office appointments requiring lab work will take precedents over walk-in lab work.  If possible, please come for your lab work on Monday and Friday afternoons, as you may experience shorter wait times. The office is located at [address], [place], [place], [place] [address] No appointment is necessary.   Labs are drawn by [facility]. Please bring your co-pay at the time of your lab draw.  You may receive a bill from [facility] for your lab work.  If you wish to have your labs drawn at another location, please call the office 24 hours in advance to send orders.  If you have any questions regarding directions or hours of operation,  please call [phone].   As a reminder, please drink plenty of water prior to coming for your lab work. Thanks!

## 2020-12-25 LAB — CBC WITH DIFFERENTIAL/PLATELET
Absolute Monocytes: 607 cells/uL (ref 200–950)
Basophils Absolute: 57 cells/uL (ref 0–200)
Basophils Relative: 0.7 %
Eosinophils Absolute: 189 cells/uL (ref 15–500)
Eosinophils Relative: 2.3 %
HCT: 45.5 % — ABNORMAL HIGH (ref 35.0–45.0)
Hemoglobin: 15 g/dL (ref 11.7–15.5)
Lymphs Abs: 1648 cells/uL (ref 850–3900)
MCH: 32.4 pg (ref 27.0–33.0)
MCHC: 33 g/dL (ref 32.0–36.0)
MCV: 98.3 fL (ref 80.0–100.0)
MPV: 9.4 fL (ref 7.5–12.5)
Monocytes Relative: 7.4 %
Neutro Abs: 5699 cells/uL (ref 1500–7800)
Neutrophils Relative %: 69.5 %
Platelets: 288 10*3/uL (ref 140–400)
RBC: 4.63 10*6/uL (ref 3.80–5.10)
RDW: 12.1 % (ref 11.0–15.0)
Total Lymphocyte: 20.1 %
WBC: 8.2 10*3/uL (ref 3.8–10.8)

## 2020-12-25 LAB — COMPLETE METABOLIC PANEL WITH GFR
AG Ratio: 1.6 (calc) (ref 1.0–2.5)
ALT: 20 U/L (ref 6–29)
AST: 18 U/L (ref 10–35)
Albumin: 4.2 g/dL (ref 3.6–5.1)
Alkaline phosphatase (APISO): 70 U/L (ref 37–153)
BUN: 14 mg/dL (ref 7–25)
CO2: 29 mmol/L (ref 20–32)
Calcium: 9.3 mg/dL (ref 8.6–10.4)
Chloride: 101 mmol/L (ref 98–110)
Creat: 0.7 mg/dL (ref 0.50–0.99)
GFR, Est African American: 108 mL/min/{1.73_m2} (ref >=60)
GFR, Est Non African American: 93 mL/min/{1.73_m2} (ref >=60)
Globulin: 2.6 g/dL (calc) (ref 1.9–3.7)
Glucose, Bld: 82 mg/dL (ref 65–99)
Potassium: 4.7 mmol/L (ref 3.5–5.3)
Sodium: 137 mmol/L (ref 135–146)
Total Bilirubin: 0.5 mg/dL (ref 0.2–1.2)
Total Protein: 6.8 g/dL (ref 6.1–8.1)

## 2020-12-25 NOTE — Progress Notes (Signed)
CBC and CMP are normal.

## 2020-12-31 ENCOUNTER — Telehealth: Payer: Self-pay

## 2020-12-31 NOTE — Telephone Encounter (Signed)
I returned patient's call.  She states that the pain is not in the joint.  She states that the pain is in the muscles and tendons.  She has not noticed any improvement from the cortisone shot because shot was given in the joint.  I offered referral to physical therapy which she declined.  I also offered referral to orthopedic surgeon but she declined.  She stated that she would rather wait and give it time to heal.  I told her to contact me in case her symptoms persist.

## 2020-12-31 NOTE — Telephone Encounter (Signed)
Patient called stating she had a cortisone injection in her right knee last week, but doesn't feel like it helped at all.  Patient states she questioned Dr. Estanislado Pandy about where she was putting the injection because she felt it wasn't the right spot.  Patient requested a return call to let her know if she can get another injection.

## 2021-01-30 ENCOUNTER — Ambulatory Visit: Payer: BC Managed Care – PPO | Admitting: Rheumatology

## 2021-02-12 ENCOUNTER — Other Ambulatory Visit: Payer: Self-pay

## 2021-02-12 MED ORDER — HYDROXYCHLOROQUINE SULFATE 200 MG PO TABS: ORAL_TABLET | ORAL | 0 refills | Status: DC

## 2021-02-12 NOTE — Telephone Encounter (Signed)
Last Visit: 12/24/2020 Next Visit: 03/26/2021 Labs: 12/24/2020, CBC and CMP are normal. Eye exam: I called patient, patient will call to schedule appt   Current Dose per office note 12/24/2020: PLQ 200 mg BID M-F QX:AFHSVEXOGA arthritis involving multiple sites with positive rheumatoid factor  Last Fill: 11/11/2020  Okay to refill Plaquenil?

## 2021-02-12 NOTE — Telephone Encounter (Signed)
Patient left a voicemail requesting prescription refill of Hydroxychloroquine.

## 2021-03-12 NOTE — Progress Notes (Deleted)
Office Visit Note  Patient: Jamie Mccall             Date of Birth: 01-Jul-1956           MRN: [id number]             PCP: Aletha Halim., PA-C Referring: Aletha Halim., PA-C Visit Date: 03/26/2021 Occupation: '@GUAROCC'$ @  Subjective:  No chief complaint on file.   History of Present Illness: Jamie Mccall is a 65 y.o. female ***   Activities of Daily Living:  Patient reports morning stiffness for *** {minute/hour:19697}.   Patient {ACTIONS;DENIES/REPORTS:21021675::"Denies"} nocturnal pain.  Difficulty dressing/grooming: {ACTIONS;DENIES/REPORTS:21021675::"Denies"} Difficulty climbing stairs: {ACTIONS;DENIES/REPORTS:21021675::"Denies"} Difficulty getting out of chair: {ACTIONS;DENIES/REPORTS:21021675::"Denies"} Difficulty using hands for taps, buttons, cutlery, and/or writing: {ACTIONS;DENIES/REPORTS:21021675::"Denies"}  No Rheumatology ROS completed.   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   Ptosis 07/31/2013   PCO (posterior capsular opacification) 03/13/2013   History of corneal transplant 08/12/2012   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[place]) 05/04/2012   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 Fredna Dow    MASS EXCISION Left 11/05/2016   Procedure: Excision of left foot mass;  Surgeon: Wylene Simmer, MD;  Location: [facility];  Service: Orthopedics;  Laterality: Left;   SQUAMOUS CELL CARCINOMA EXCISION  08/18/2018   right eye   Social History   Social History Narrative   Not on file   Immunization History  Administered Date(s) Administered   Influenza,inj,Quad PF,6+ Mos 08/18/2017, 08/18/2017     Objective: Vital Signs: There were no vitals taken for this visit.   Physical Exam   Musculoskeletal Exam: ***  CDAI Exam: CDAI Score: -- Patient Global: --; Provider Global: -- Swollen: --; Tender: -- Joint Exam 03/26/2021   No joint exam has been documented for this visit   There is currently no information documented on the homunculus. Go to the Rheumatology activity and complete the homunculus joint exam.  Investigation: No additional findings.  Imaging: No results found.  Recent Labs: Lab Results  Component Value Date   WBC 8.2 12/24/2020   HGB 15.0 12/24/2020   PLT 288 12/24/2020   NA 137 12/24/2020   K 4.7 12/24/2020   CL 101 12/24/2020   CO2 29 12/24/2020   GLUCOSE 82 12/24/2020   BUN  14 12/24/2020   CREATININE 0.70 12/24/2020   BILITOT 0.5 12/24/2020   ALKPHOS 61 10/13/2016   AST 18 12/24/2020   ALT 20 12/24/2020   PROT 6.8 12/24/2020   ALBUMIN 3.7 10/13/2016   CALCIUM 9.3 12/24/2020   GFRAA 108 12/24/2020   QFTBGOLDPLUS NEGATIVE 12/22/2017    Speciality Comments: 06/27/14 DR. DEV ONLY -KBD  I called patient regarding PLQ eye exam. shc 02/12/2021  pt states she will  not use Enbrel again/ causes Bronchitis,    Procedures:  No procedures performed Allergies: Enbrel [etanercept] and Sulfasalazine   Assessment / Plan:     Visit Diagnoses: No diagnosis found.  Orders: No orders of the defined types were placed in this encounter.  No orders of the defined types were placed in this encounter.   Face-to-face time spent with patient was *** minutes. Greater than 50% of time was spent in counseling and coordination of care.  Follow-Up Instructions: No follow-ups on file.   Earnestine Mealing, CMA  Note - This record has been created using Editor, commissioning.  Chart creation errors have been sought, but may not always  have been located. Such creation errors do not reflect on  the standard of medical care.

## 2021-03-26 ENCOUNTER — Ambulatory Visit: Payer: BC Managed Care – PPO | Admitting: Rheumatology

## 2021-03-26 DIAGNOSIS — Z947 Corneal transplant status: Secondary | ICD-10-CM

## 2021-03-26 DIAGNOSIS — Z780 Asymptomatic menopausal state: Secondary | ICD-10-CM

## 2021-03-26 DIAGNOSIS — Z79899 Other long term (current) drug therapy: Secondary | ICD-10-CM

## 2021-03-26 DIAGNOSIS — M0579 Rheumatoid arthritis with rheumatoid factor of multiple sites without organ or systems involvement: Secondary | ICD-10-CM

## 2021-03-26 DIAGNOSIS — R5383 Other fatigue: Secondary | ICD-10-CM

## 2021-03-26 DIAGNOSIS — Z8659 Personal history of other mental and behavioral disorders: Secondary | ICD-10-CM

## 2021-03-26 DIAGNOSIS — Z8679 Personal history of other diseases of the circulatory system: Secondary | ICD-10-CM

## 2021-03-26 DIAGNOSIS — M25561 Pain in right knee: Secondary | ICD-10-CM

## 2021-04-11 NOTE — Progress Notes (Signed)
Office Visit Note  Patient: Jamie Mccall             Date of Birth: [birth year]           MRN: [id number]             PCP: Aletha Halim., PA-C Referring: Aletha Halim., PA-C Visit Date: 04/25/2021 Occupation: '@GUAROCC'$ @  Subjective:  Medication management.   History of Present Illness: Jamie Mccall is a 65 y.o. female with history of rheumatoid arthritis, osteoarthritis and degenerative disc disease.  She has denies any increased pain or discomfort.  She has not noticed any increased joint swelling.  She has been tolerating Plaquenil well.  Activities of Daily Living:  Patient reports morning stiffness for 0 minutes.   Patient Denies nocturnal pain.  Difficulty dressing/grooming: Denies Difficulty climbing stairs: Denies Difficulty getting out of chair: Denies Difficulty using hands for taps, buttons, cutlery, and/or writing: Denies  Review of Systems  Constitutional:  Negative for fatigue, night sweats, weight gain and weight loss.  HENT:  Negative for mouth sores, trouble swallowing, trouble swallowing, mouth dryness and nose dryness.   Eyes:  Negative for pain, redness, itching, visual disturbance and dryness.  Respiratory:  Negative for cough, shortness of breath and difficulty breathing.   Cardiovascular:  Negative for chest pain, palpitations, hypertension, irregular heartbeat and swelling in legs/feet.  Gastrointestinal:  Negative for blood in stool, constipation and diarrhea.  Endocrine: Negative for increased urination.  Genitourinary:  Negative for difficulty urinating and vaginal dryness.  Musculoskeletal:  Negative for joint pain, joint pain, joint swelling, myalgias, muscle weakness, morning stiffness, muscle tenderness and myalgias.  Skin:  Negative for color change, rash, hair loss, redness, skin tightness, ulcers and sensitivity to sunlight.  Allergic/Immunologic: Negative for susceptible to infections.  Neurological:  Negative for dizziness,  numbness, headaches, memory loss, night sweats and weakness.  Hematological:  Positive for bruising/bleeding tendency. Negative for swollen glands.  Psychiatric/Behavioral:  Negative for depressed mood, confusion and sleep disturbance. The patient is not nervous/anxious.    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   Ptosis 07/31/2013   PCO (posterior capsular opacification) 03/13/2013   History of corneal transplant 08/12/2012   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[place]) 05/04/2012   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 Fredna Dow    MASS EXCISION Left 11/05/2016   Procedure: Excision of left foot mass;  Surgeon: Wylene Simmer, MD;  Location: [facility];  Service: Orthopedics;  Laterality: Left;   SQUAMOUS CELL CARCINOMA EXCISION  08/18/2018   right eye    Social History   Social History Narrative   Not on file   Immunization History  Administered Date(s) Administered   Influenza,inj,Quad PF,6+ Mos 08/18/2017, 08/18/2017     Objective: Vital Signs: BP (!) 179/99 (BP Location: Left Arm, Patient Position: Sitting, Cuff Size: Normal)   Pulse 81   Ht 5' 4.5" (1.638 m)   Wt 212 lb 12.8 oz (96.5 kg)   BMI 35.96 kg/m    Physical Exam Vitals and nursing note reviewed.  Constitutional:      Appearance: She is well-developed.  HENT:     Head: Normocephalic and atraumatic.  Eyes:     Conjunctiva/sclera: Conjunctivae normal.  Cardiovascular:     Rate and Rhythm: Normal rate and regular rhythm.     Heart sounds: Normal heart sounds.  Pulmonary:     Effort: Pulmonary effort is normal.     Breath sounds: Normal breath sounds.  Abdominal:     General: Bowel sounds are normal.     Palpations: Abdomen is soft.  Musculoskeletal:     Cervical back: Normal range of motion.  Lymphadenopathy:     Cervical: No cervical adenopathy.  Skin:    General: Skin is warm and dry.     Capillary Refill: Capillary refill takes less than 2 seconds.  Neurological:     Mental Status: She is alert and oriented to person, place, and time.  Psychiatric:        Behavior: Behavior normal.     Musculoskeletal Exam: C-spine was in good range of motion.  Shoulder joints, elbow joints, and wrist joints were in good range of motion.  She has synovial thickening over MCP joints.  PIP and DIP thickening with no synovitis was noted.  Hip joints and knee joints with good range of motion.  There was no tenderness over ankles or MTPs.  CDAI Exam: CDAI Score: 2.4  Patient Global: 1 mm; Provider Global: 3 mm Swollen: 4 ; Tender: 0  Joint Exam 04/25/2021      Right  Left  MCP 2  Swollen   Swollen   Ankle  Swollen   Swollen      Investigation: No additional findings.  Imaging: No results found.  Recent Labs: Lab Results  Component Value Date   WBC 8.2  12/24/2020   HGB 15.0 12/24/2020   PLT 288 12/24/2020   NA 137 12/24/2020   K 4.7 12/24/2020   CL 101 12/24/2020   CO2 29 12/24/2020   GLUCOSE 82 12/24/2020   BUN 14 12/24/2020   CREATININE 0.70 12/24/2020   BILITOT 0.5 12/24/2020   ALKPHOS 61 10/13/2016   AST 18 12/24/2020   ALT 20 12/24/2020   PROT 6.8 12/24/2020   ALBUMIN 3.7 10/13/2016   CALCIUM 9.3 12/24/2020   GFRAA 108 12/24/2020   QFTBGOLDPLUS NEGATIVE 12/22/2017    Speciality Comments: 06/27/14 DR. DEV ONLY -KBD  I called patient regarding PLQ eye exam. shc 02/12/2021  pt states she will not use Enbrel again/ causes Bronchitis,    Procedures:  No procedures performed Allergies: Enbrel [etanercept] and Sulfasalazine   Assessment / Plan:     Visit Diagnoses: Rheumatoid arthritis involving multiple sites with positive rheumatoid factor (HCC) - positive RF, positive  anti-CCP, +'14 3 3 '$ eta.  She has severe rheumatoid arthritis involving multiple joints.  She continues to have synovial thickening.  She denies any joint discomfort.  She does not want any aggressive medications.  She has been taking Plaquenil 1 p.o. twice daily Monday to Friday without any side effects.  High risk medication use - Started on PLQ at her last visit on 10/29/20.  PLQ 200 mg BID M-F - Plan: CBC with Differential/Platelet, COMPLETE METABOLIC PANEL WITH GFR  Primary osteoarthritis of both hands-she has osteoarthritis in her hands which causes chronic discomfort.  History of corneal transplant-followed by ophthalmologist.  History of hypertension-blood pressure is elevated today.  She has been advised to monitor blood pressure closely and follow-up with her PCP.  History of depression  Other fatigue  Postmenopausal-she is supposed to schedule DEXA scan with her PCP.  She has had previous DEXA scan which was normal per patient.  Vitamin D deficiency -she gets history of vitamin D deficiency in the past.: Repeat vitamin D level.  Plan: VITAMIN D  25 Hydroxy (Vit-D Deficiency, Fractures)  Orders: Orders Placed This Encounter  Procedures   CBC with Differential/Platelet   COMPLETE METABOLIC PANEL WITH GFR   VITAMIN D 25 Hydroxy (Vit-D Deficiency, Fractures)   No orders of the defined types were placed in this encounter.    Follow-Up Instructions: Return in about 5 months (around 09/25/2021) for Rheumatoid arthritis.   Bo Merino, MD  Note - This record has been created using Editor, commissioning.  Chart creation errors have been sought, but may not always  have been located. Such creation errors do not reflect on  the standard of medical care.,

## 2021-04-25 ENCOUNTER — Encounter: Payer: Self-pay | Admitting: Rheumatology

## 2021-04-25 ENCOUNTER — Ambulatory Visit (INDEPENDENT_AMBULATORY_CARE_PROVIDER_SITE_OTHER): Payer: BC Managed Care – PPO | Admitting: Rheumatology

## 2021-04-25 ENCOUNTER — Other Ambulatory Visit: Payer: Self-pay

## 2021-04-25 VITALS — BP 179/99 | HR 81 | Ht 64.5 in | Wt 212.8 lb

## 2021-04-25 DIAGNOSIS — Z79899 Other long term (current) drug therapy: Secondary | ICD-10-CM | POA: Diagnosis not present

## 2021-04-25 DIAGNOSIS — M0579 Rheumatoid arthritis with rheumatoid factor of multiple sites without organ or systems involvement: Secondary | ICD-10-CM | POA: Diagnosis not present

## 2021-04-25 DIAGNOSIS — Z780 Asymptomatic menopausal state: Secondary | ICD-10-CM

## 2021-04-25 DIAGNOSIS — M19041 Primary osteoarthritis, right hand: Secondary | ICD-10-CM | POA: Diagnosis not present

## 2021-04-25 DIAGNOSIS — R5383 Other fatigue: Secondary | ICD-10-CM

## 2021-04-25 DIAGNOSIS — Z947 Corneal transplant status: Secondary | ICD-10-CM

## 2021-04-25 DIAGNOSIS — M19042 Primary osteoarthritis, left hand: Secondary | ICD-10-CM

## 2021-04-25 DIAGNOSIS — Z8679 Personal history of other diseases of the circulatory system: Secondary | ICD-10-CM

## 2021-04-25 DIAGNOSIS — Z8659 Personal history of other mental and behavioral disorders: Secondary | ICD-10-CM

## 2021-04-25 DIAGNOSIS — E559 Vitamin D deficiency, unspecified: Secondary | ICD-10-CM

## 2021-04-26 LAB — CBC WITH DIFFERENTIAL/PLATELET
Absolute Monocytes: 572 cells/uL (ref 200–950)
Basophils Absolute: 59 cells/uL (ref 0–200)
Basophils Relative: 1 %
Eosinophils Absolute: 171 cells/uL (ref 15–500)
Eosinophils Relative: 2.9 %
HCT: 44.2 % (ref 35.0–45.0)
Hemoglobin: 14.9 g/dL (ref 11.7–15.5)
Lymphs Abs: 1227 cells/uL (ref 850–3900)
MCH: 33.3 pg — ABNORMAL HIGH (ref 27.0–33.0)
MCHC: 33.7 g/dL (ref 32.0–36.0)
MCV: 98.9 fL (ref 80.0–100.0)
MPV: 9.7 fL (ref 7.5–12.5)
Monocytes Relative: 9.7 %
Neutro Abs: 3870 cells/uL (ref 1500–7800)
Neutrophils Relative %: 65.6 %
Platelets: 278 10*3/uL (ref 140–400)
RBC: 4.47 10*6/uL (ref 3.80–5.10)
RDW: 11.6 % (ref 11.0–15.0)
Total Lymphocyte: 20.8 %
WBC: 5.9 10*3/uL (ref 3.8–10.8)

## 2021-04-26 LAB — COMPLETE METABOLIC PANEL WITH GFR
AG Ratio: 1.7 (calc) (ref 1.0–2.5)
ALT: 18 U/L (ref 6–29)
AST: 19 U/L (ref 10–35)
Albumin: 4.2 g/dL (ref 3.6–5.1)
Alkaline phosphatase (APISO): 72 U/L (ref 37–153)
BUN: 11 mg/dL (ref 7–25)
CO2: 32 mmol/L (ref 20–32)
Calcium: 9.2 mg/dL (ref 8.6–10.4)
Chloride: 100 mmol/L (ref 98–110)
Creat: 0.62 mg/dL (ref 0.50–1.05)
Globulin: 2.5 g/dL (calc) (ref 1.9–3.7)
Glucose, Bld: 85 mg/dL (ref 65–99)
Potassium: 4.8 mmol/L (ref 3.5–5.3)
Sodium: 137 mmol/L (ref 135–146)
Total Bilirubin: 0.6 mg/dL (ref 0.2–1.2)
Total Protein: 6.7 g/dL (ref 6.1–8.1)
eGFR: 100 mL/min/{1.73_m2} (ref >=60)

## 2021-04-26 LAB — VITAMIN D 25 HYDROXY (VIT D DEFICIENCY, FRACTURES): Vit D, 25-Hydroxy: 45 ng/mL (ref 30–100)

## 2021-04-28 NOTE — Progress Notes (Signed)
CBC and CMP are normal.  Vitamin D is 45 which is normal.

## 2021-05-05 ENCOUNTER — Other Ambulatory Visit: Payer: Self-pay | Admitting: Physician Assistant

## 2021-05-05 NOTE — Telephone Encounter (Signed)
Next Visit: Due February 2023.   Last Visit: 04/25/2021  Labs: 04/25/2021 CBC and CMP are normal.  Eye exam: no eye exam on file.    Current Dose per office note 04/25/2021: PLQ 200 mg BID M-F  GQ:HQIXMDEKIY arthritis involving multiple sites with positive rheumatoid factor   Last Fill: 02/12/2021  Left message to advise patient to call the office to discuss PLQ eye exam and schedule a follow up appointment.   Okay to refill Plaquenil?

## 2021-05-23 ENCOUNTER — Other Ambulatory Visit: Payer: Self-pay | Admitting: Rheumatology

## 2021-05-26 NOTE — Telephone Encounter (Signed)
Next Visit: 09/30/2021  Last Visit: 04/25/2021  Labs: 04/25/2021 CBC and CMP are normal.  Eye exam: Appointment scheduled for 06/25/2021   Current Dose per office note 04/25/2021: Plaquenil 1 p.o. twice daily Monday to Friday   DX: Rheumatoid arthritis involving multiple sites with positive rheumatoid factor   Last Fill: 05/05/2021 (30 day supply)  Okay to refill Plaquenil?

## 2021-06-25 ENCOUNTER — Other Ambulatory Visit: Payer: Self-pay

## 2021-06-25 NOTE — Telephone Encounter (Signed)
We would not be able to refill hydroxychloroquine without eye examination as there is increased risk of ocular toxicity and loss of vision without monitoring.

## 2021-06-25 NOTE — Telephone Encounter (Signed)
Patient called to let Dr. Estanislado Pandy know that [facility] rescheduled her Plaquenil eye exam for 07/23/21.  Patient states this is the 2nd time they have rescheduled her appointment.  Patient states she only has 6 tablets remaining and [facility] [address].

## 2021-06-25 NOTE — Telephone Encounter (Signed)
Next Visit: 09/30/2020   Last Visit: 04/25/2021   Labs: 04/25/2021 CBC and CMP are normal.   Eye exam: no eye exam on file.     Current Dose per office note 04/25/2021: PLQ 200 mg BID M-F   MG:NOIBBCWUGQ arthritis involving multiple sites with positive rheumatoid factor    Last Fill: 05/26/2021 (30 day supply)   [facility] rescheduled her Plaquenil eye exam for 07/23/21. Patient started PLQ in March 2022.    Okay to refill Plaquenil?

## 2021-06-25 NOTE — Telephone Encounter (Signed)
Patient advised we would not be able to refill hydroxychloroquine without eye examination as there is increased risk of ocular toxicity and loss of vision without monitoring. Patient expressed understanding.

## 2021-07-01 ENCOUNTER — Other Ambulatory Visit: Payer: Self-pay | Admitting: Physician Assistant

## 2021-07-01 NOTE — Telephone Encounter (Signed)
Next Visit: 09/30/2021  Last Visit: 04/25/2021  Labs: 04/25/2021 CBC and CMP are normal.  Eye exam: not on file. (Patient was supposed to have an appointment on 06/25/2021, Unsure of where)  Current Dose per office note 04/25/2021: Plaquenil 1 p.o. twice daily Monday to Friday   JM:EQASTMHDQQ arthritis involving multiple sites with positive rheumatoid factor   Last Fill: 05/26/2021 (30 day supply)  Left message to advise patient we need her PLQ eye exam.   Okay to refill Plaquenil?

## 2021-07-28 ENCOUNTER — Other Ambulatory Visit: Payer: Self-pay | Admitting: Rheumatology

## 2021-07-28 NOTE — Telephone Encounter (Signed)
Patient called the office stating she had her Plaquenil eye exam today and is requesting a refill of her Plaquenil 200 mg [facility] [address] as soon as the results come in.

## 2021-07-29 MED ORDER — HYDROXYCHLOROQUINE SULFATE 200 MG PO TABS: ORAL_TABLET | ORAL | 0 refills | Status: DC

## 2021-07-29 NOTE — Telephone Encounter (Addendum)
Spoke with patient and advised we received information from the eye doctor but it did not give the okay to continue PLQ or not. Patient advised we have faxed the form to their office if she would like to call them to ask them to complete and return as soon as possible.   Next Visit: 09/30/2021   Last Visit: 04/25/2021   Labs: 04/25/2021 CBC and CMP are normal.   Eye exam: not on file.   Current Dose per office note 04/25/2021: Plaquenil 1 p.o. twice daily Monday to Friday    RA:XENMMHWKGS arthritis involving multiple sites with positive rheumatoid factor    Last Fill: 05/26/2021 (30 day supply)   Okay to refill Plaquenil?

## 2021-08-20 ENCOUNTER — Telehealth: Payer: Self-pay | Admitting: *Deleted

## 2021-08-20 NOTE — Telephone Encounter (Signed)
Left message to advise patient we received notes from her eye doctor. Advised they only did VF and that ACR recommends OCt yearl for detection of changes while on PLQ. Advised patient she would need to call the eye doctor to schedule that appointment.

## 2021-09-04 ENCOUNTER — Other Ambulatory Visit: Payer: Self-pay | Admitting: Physician Assistant

## 2021-09-05 ENCOUNTER — Other Ambulatory Visit: Payer: Self-pay | Admitting: Rheumatology

## 2021-09-05 MED ORDER — HYDROXYCHLOROQUINE SULFATE 200 MG PO TABS: ORAL_TABLET | ORAL | 0 refills | Status: DC

## 2021-09-05 NOTE — Telephone Encounter (Signed)
Patient called the office requesting a refill of [facility] [address] and we denied it.

## 2021-09-05 NOTE — Telephone Encounter (Signed)
Next Visit: 09/30/2021   Last Visit: 04/25/2021   Labs: 04/25/2021 CBC and CMP are normal.   Eye exam: not on file.Left message to advise patient we received notes from her eye doctor. Advised they only did VF and that ACR recommends OCt yearl for detection of changes while on PLQ. Advised patient she would need to call the eye doctor to schedule that appointment.     Current Dose per office note 04/25/2021: Plaquenil 1 p.o. twice daily Monday to Friday    TG:PQDIYMEBRA arthritis involving multiple sites with positive rheumatoid factor    Last Fill: 07/29/2021(30 day supply)   Okay to refill Plaquenil?

## 2021-09-16 NOTE — Progress Notes (Signed)
Office Visit Note  Patient: Jamie Mccall             Date of Birth: [birth year]           MRN: [id number]             PCP: Aletha Halim., PA-C Referring: Aletha Halim., PA-C Visit Date: 09/30/2021 Occupation: @GUAROCC @  Subjective:  Medication monitoring   History of Present Illness: Jamie Mccall is a 66 y.o. female with history of seropositive rheumatoid arthritis and osteoarthritis.  She is taking Plaquenil 200 mg 1 tablet by mouth twice daily Monday through Friday.  She is tolerating Plaquenil without any side effects and denies missing any doses recently.  She denies any recent rheumatoid arthritis flares.  She has not had any joint pain or joint swelling recently.  She denies any nocturnal pain.  She has not had any difficulty with ADLs.  She denies any new medical conditions.  She has not had any recent infections. She has noticed a rash on both elbows and has been using several topical agents with minimal improvement.    Activities of Daily Living:  Patient reports morning stiffness for 0 minutes.   Patient Denies nocturnal pain.  Difficulty dressing/grooming: Denies Difficulty climbing stairs: Denies Difficulty getting out of chair: Denies Difficulty using hands for taps, buttons, cutlery, and/or writing: Denies  Review of Systems  Constitutional:  Negative for fatigue.  HENT:  Negative for mouth sores, mouth dryness and nose dryness.   Eyes:  Negative for pain, itching and dryness.  Respiratory:  Negative for shortness of breath and difficulty breathing.   Cardiovascular:  Negative for chest pain and palpitations.  Gastrointestinal:  Negative for blood in stool, constipation and diarrhea.  Endocrine: Negative for increased urination.  Genitourinary:  Negative for difficulty urinating.  Musculoskeletal:  Negative for joint pain, joint pain, joint swelling, myalgias, morning stiffness, muscle tenderness and myalgias.  Skin:  Negative for color change,  rash and redness.  Allergic/Immunologic: Negative for susceptible to infections.  Neurological:  Negative for dizziness, numbness, headaches, memory loss and weakness.  Hematological:  Positive for bruising/bleeding tendency.  Psychiatric/Behavioral:  Negative for confusion.    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   Ptosis 07/31/2013   PCO (posterior capsular opacification) 03/13/2013   History of corneal transplant 08/12/2012   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[place]) 05/04/2012   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 Fredna Dow    MASS EXCISION Left 11/05/2016   Procedure: Excision of left foot mass;  Surgeon: Wylene Simmer, MD;  Location: [facility];  Service: Orthopedics;  Laterality: Left;   SQUAMOUS CELL CARCINOMA EXCISION  08/18/2018   right eye    Social History   Social History Narrative   Not on file   Immunization History  Administered Date(s) Administered   Influenza,inj,Quad PF,6+ Mos 08/18/2017, 08/18/2017     Objective: Vital Signs: BP (!) 177/94 (BP Location: Left Arm, Patient Position: Sitting, Cuff Size: Normal)    Pulse 96    Ht 5' 4.5" (1.638 m)    Wt 220 lb 12.8 oz (100.2 kg)    BMI 37.31 kg/m    Physical Exam Vitals and nursing note reviewed.  Constitutional:      Appearance: She is well-developed.  HENT:     Head: Normocephalic and atraumatic.  Eyes:     Conjunctiva/sclera: Conjunctivae normal.  Cardiovascular:     Rate and Rhythm: Normal rate and regular rhythm.     Heart sounds: Normal heart sounds.  Pulmonary:     Effort: Pulmonary effort is normal.     Breath sounds: Normal breath sounds.  Abdominal:     General: Bowel sounds are normal.     Palpations: Abdomen is soft.  Musculoskeletal:     Cervical back: Normal range of motion.  Lymphadenopathy:     Cervical: No cervical adenopathy.  Skin:    General: Skin is warm and dry.     Capillary Refill: Capillary refill takes less than 2 seconds.  Neurological:     Mental Status: She is alert and oriented to person, place, and time.  Psychiatric:        Behavior: Behavior normal.     Musculoskeletal Exam: C-spine has good range of motion with no discomfort.  Shoulder joints, elbow joints, wrist joints have good range of motion with no discomfort.  Synovial thickening over MCP joints especially bilateral second MCPs.  PIP and DIP thickening consistent with osteoarthritis of both hands.  Hip joints have good range of motion with no groin pain.  Knee joints have good range of motion with no warmth or effusion.  Ankle joints have good range of motion with no tenderness or joint swelling.  CDAI Exam: CDAI Score: 0.2  Patient Global: 1 mm; Provider Global: 1 mm Swollen: 0 ; Tender: 0  Joint Exam 09/30/2021   No joint exam has been documented for  this visit   There is currently no information documented on the homunculus. Go to the Rheumatology activity and complete the homunculus joint exam.  Investigation: No additional findings.  Imaging: No results found.  Recent Labs: Lab Results  Component Value Date   WBC 5.9 04/25/2021   HGB 14.9 04/25/2021   PLT 278 04/25/2021   NA 137 04/25/2021   K 4.8 04/25/2021   CL 100 04/25/2021   CO2 32 04/25/2021   GLUCOSE 85 04/25/2021   BUN 11 04/25/2021   CREATININE 0.62 04/25/2021   BILITOT 0.6 04/25/2021   ALKPHOS 61 10/13/2016   AST 19 04/25/2021   ALT 18 04/25/2021   PROT 6.7 04/25/2021   ALBUMIN 3.7 10/13/2016   CALCIUM 9.2 04/25/2021   GFRAA 108 12/24/2020   QFTBGOLDPLUS NEGATIVE 12/22/2017    Speciality Comments: I called patient regarding PLQ eye exam.  shc 02/12/2021  pt states she will not use Enbrel again/ causes Bronchitis,   Procedures:  No procedures performed Allergies: Enbrel [etanercept] and Sulfasalazine   Assessment / Plan:     Visit Diagnoses: Rheumatoid arthritis involving multiple sites with positive rheumatoid factor (HCC) - positive RF, positive anti-CCP, +14 3 3  eta.  She has severe rheumatoid arthritis involving multiple joints: She has no joint tenderness or synovitis on examination today.  She has not had any recent rheumatoid arthritis flares.  She synovial thickening over bilateral second MCP joints but no inflammation was noted.  She is clinically doing well taking Plaquenil 200 mg 1 tablet by mouth twice daily Monday through Friday.  She is tolerating Plaquenil without any side effects and has not missed any doses recently.  She will remain on Plaquenil as monotherapy.  She was advised to notify us if she develops increased joint pain or joint swelling.  She will follow-up in the office in 5 months.  High risk medication use -Plaquenil 200 mg 1 tablet by mouth twice daily Monday through Friday.  A refill was sent to the pharmacy today.  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  Patient has declined Biologics in the past. CBC and CMP updated on 04/25/21.  CBC and CMP will be drawn today to monitor for drug toxicity.  The patient has had updated OCT testing so we will call to obtain these records.  - Plan: CBC with Differential/Platelet, COMPLETE METABOLIC PANEL WITH GFR  Primary osteoarthritis of both hands: She has PIP and DIP thickening consistent with osteoarthritis of both hands.  She is able to make a complete fist bilaterally.  She has no tenderness or inflammation on examination today.  Other medical conditions are listed as follows:  History of corneal transplant - Followed by ophthalmologist at [facility].   History of hypertension  History of depression  Other fatigue  Vitamin D deficiency  Postmenopausal -  She has had previous DEXA scan which was normal per patient.  Orders: Orders Placed This Encounter  Procedures   CBC with Differential/Platelet   COMPLETE METABOLIC PANEL WITH GFR   Meds ordered this encounter  Medications   hydroxychloroquine (PLAQUENIL) 200 MG tablet    Sig: TAKE 1 TABLET BY MOUTH TWICE DAILY MONDAY THROUGH FRIDAY ONLY    Dispense:  120 tablet    Refill:  0     Follow-Up Instructions: Return in about 6 months (around 03/30/2022) for Rheumatoid arthritis, Osteoarthritis.   Ofilia Neas, PA-C  Note - This record has been created using Dragon software.  Chart creation errors have been sought, but may not always  have been located. Such creation errors do not reflect on  the standard of medical care.

## 2021-09-30 ENCOUNTER — Ambulatory Visit (INDEPENDENT_AMBULATORY_CARE_PROVIDER_SITE_OTHER): Payer: BC Managed Care – PPO | Admitting: Physician Assistant

## 2021-09-30 ENCOUNTER — Encounter: Payer: Self-pay | Admitting: Physician Assistant

## 2021-09-30 ENCOUNTER — Other Ambulatory Visit: Payer: Self-pay

## 2021-09-30 VITALS — BP 177/94 | HR 96 | Ht 64.5 in | Wt 220.8 lb

## 2021-09-30 DIAGNOSIS — Z8659 Personal history of other mental and behavioral disorders: Secondary | ICD-10-CM

## 2021-09-30 DIAGNOSIS — Z947 Corneal transplant status: Secondary | ICD-10-CM

## 2021-09-30 DIAGNOSIS — M0579 Rheumatoid arthritis with rheumatoid factor of multiple sites without organ or systems involvement: Secondary | ICD-10-CM | POA: Diagnosis not present

## 2021-09-30 DIAGNOSIS — E559 Vitamin D deficiency, unspecified: Secondary | ICD-10-CM

## 2021-09-30 DIAGNOSIS — R5383 Other fatigue: Secondary | ICD-10-CM

## 2021-09-30 DIAGNOSIS — R21 Rash and other nonspecific skin eruption: Secondary | ICD-10-CM

## 2021-09-30 DIAGNOSIS — Z79899 Other long term (current) drug therapy: Secondary | ICD-10-CM | POA: Diagnosis not present

## 2021-09-30 DIAGNOSIS — M19042 Primary osteoarthritis, left hand: Secondary | ICD-10-CM

## 2021-09-30 DIAGNOSIS — Z780 Asymptomatic menopausal state: Secondary | ICD-10-CM

## 2021-09-30 DIAGNOSIS — M19041 Primary osteoarthritis, right hand: Secondary | ICD-10-CM | POA: Diagnosis not present

## 2021-09-30 DIAGNOSIS — Z8679 Personal history of other diseases of the circulatory system: Secondary | ICD-10-CM

## 2021-09-30 LAB — COMPLETE METABOLIC PANEL WITH GFR
AG Ratio: 1.6 (calc) (ref 1.0–2.5)
ALT: 14 U/L (ref 6–29)
AST: 16 U/L (ref 10–35)
Albumin: 4 g/dL (ref 3.6–5.1)
Alkaline phosphatase (APISO): 74 U/L (ref 37–153)
BUN: 14 mg/dL (ref 7–25)
CO2: 29 mmol/L (ref 20–32)
Calcium: 9 mg/dL (ref 8.6–10.4)
Chloride: 103 mmol/L (ref 98–110)
Creat: 0.62 mg/dL (ref 0.50–1.05)
Globulin: 2.5 g/dL (calc) (ref 1.9–3.7)
Glucose, Bld: 85 mg/dL (ref 65–99)
Potassium: 4.6 mmol/L (ref 3.5–5.3)
Sodium: 138 mmol/L (ref 135–146)
Total Bilirubin: 0.5 mg/dL (ref 0.2–1.2)
Total Protein: 6.5 g/dL (ref 6.1–8.1)
eGFR: 100 mL/min/{1.73_m2} (ref >=60)

## 2021-09-30 LAB — CBC WITH DIFFERENTIAL/PLATELET
Absolute Monocytes: 572 cells/uL (ref 200–950)
Basophils Absolute: 52 cells/uL (ref 0–200)
Basophils Relative: 0.8 %
Eosinophils Absolute: 163 cells/uL (ref 15–500)
Eosinophils Relative: 2.5 %
HCT: 43.1 % (ref 35.0–45.0)
Hemoglobin: 14.5 g/dL (ref 11.7–15.5)
Lymphs Abs: 1580 cells/uL (ref 850–3900)
MCH: 32.7 pg (ref 27.0–33.0)
MCHC: 33.6 g/dL (ref 32.0–36.0)
MCV: 97.3 fL (ref 80.0–100.0)
MPV: 9.7 fL (ref 7.5–12.5)
Monocytes Relative: 8.8 %
Neutro Abs: 4134 cells/uL (ref 1500–7800)
Neutrophils Relative %: 63.6 %
Platelets: 274 10*3/uL (ref 140–400)
RBC: 4.43 10*6/uL (ref 3.80–5.10)
RDW: 12.3 % (ref 11.0–15.0)
Total Lymphocyte: 24.3 %
WBC: 6.5 10*3/uL (ref 3.8–10.8)

## 2021-09-30 MED ORDER — HYDROXYCHLOROQUINE SULFATE 200 MG PO TABS: ORAL_TABLET | ORAL | 0 refills | Status: DC

## 2021-09-30 NOTE — Addendum Note (Signed)
Addended by: Earnestine Mealing on: 09/30/2021 04:31 PM   Modules accepted: Orders

## 2021-10-01 NOTE — Progress Notes (Signed)
CBC and CMP WNL

## 2021-10-08 ENCOUNTER — Other Ambulatory Visit: Payer: Self-pay | Admitting: Rheumatology

## 2021-10-08 MED ORDER — PREDNISONE 5 MG PO TABS: ORAL_TABLET | ORAL | 0 refills | Status: DC

## 2021-10-08 NOTE — Telephone Encounter (Signed)
Patient called the office stating she was having a flare up in er left hand. Patient states it is throbbing and tight. Patient states [facility] [address] Patient states she wants that sent in this morning rather then later because if she takes it tonight she wont be able to sleep. Patient was adamant about having it sent in soon.  ?

## 2021-10-08 NOTE — Telephone Encounter (Signed)
Patient states she is having pain in her left palm. Patient states it is a burning pain and her hand is tight. Patient states she has tried ibuprofen with little relief. Patient states she had difficulty sleeping due to the pain. Patient states she is taking PLQ as prescribed. Patient is requesting a prescription for prednisone. Please advise.  ?

## 2021-10-08 NOTE — Telephone Encounter (Signed)
Ok to send in prednisone 20 mg tapering by 5 mg every 2 days.  If her symptoms persist or worsen please advise the patient to schedule a sooner follow up visit for evaluation.

## 2021-11-21 ENCOUNTER — Telehealth: Payer: Self-pay | Admitting: *Deleted

## 2021-11-21 NOTE — Telephone Encounter (Signed)
Received Eye exam from [facility]  ?Paperwork did not state whether or not there was PLQ toxicity. Faxed our PLQ eye exam form 4 times and no response.  ?

## 2022-01-31 IMAGING — MG DIGITAL SCREENING BILAT W/ TOMO W/ CAD
8 series · 8 of 24 positions shown · non-contrast
Comparison: Previous exam(s).

CLINICAL DATA: Screening.

EXAM:
DIGITAL SCREENING BILATERAL MAMMOGRAM WITH TOMO AND CAD

[L CC synth-2D]
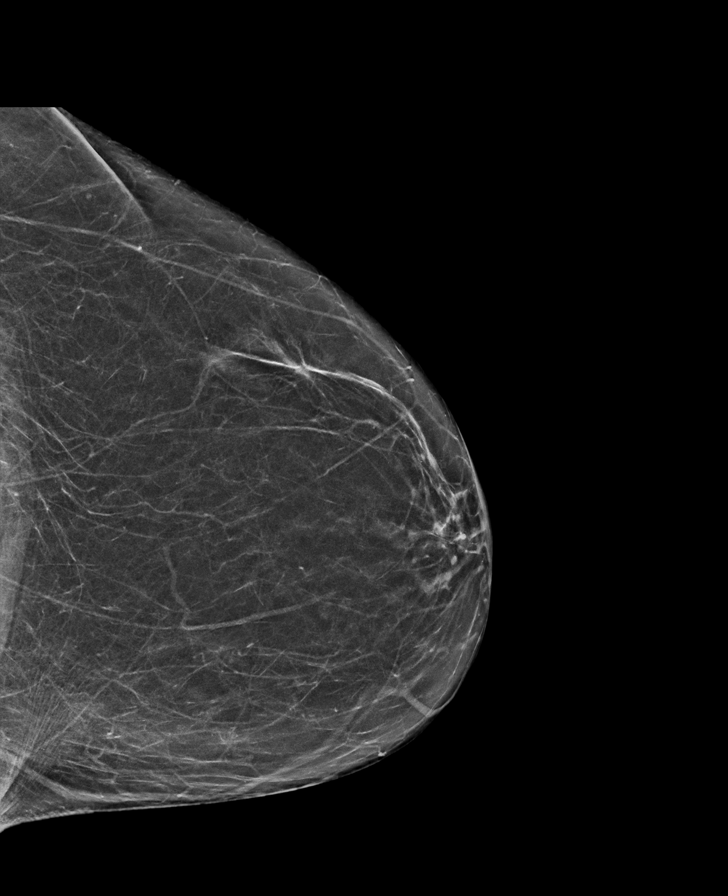

[L MLO synth-2D]
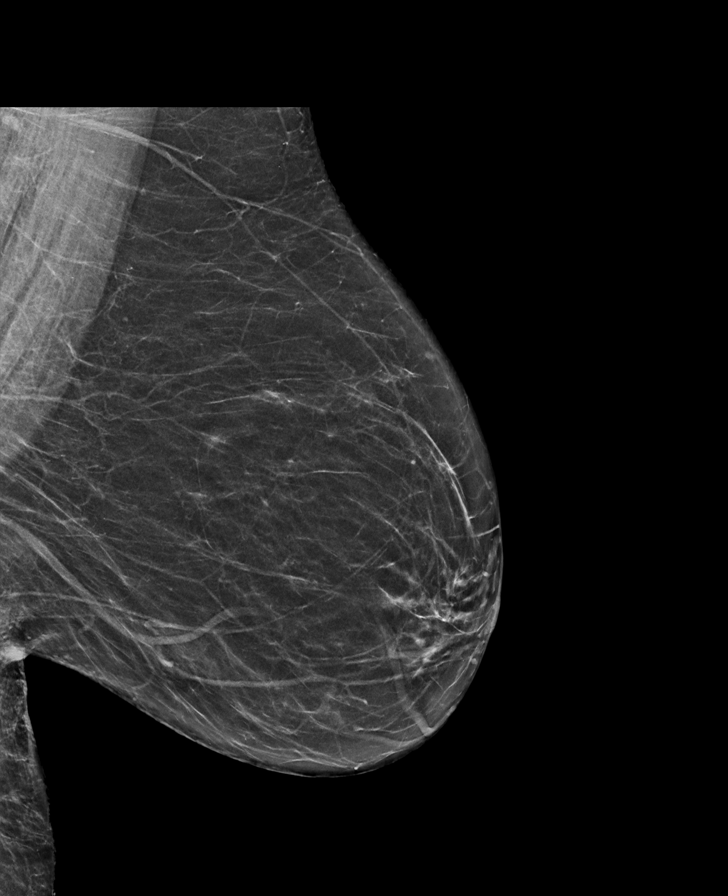

[R CC synth-2D]
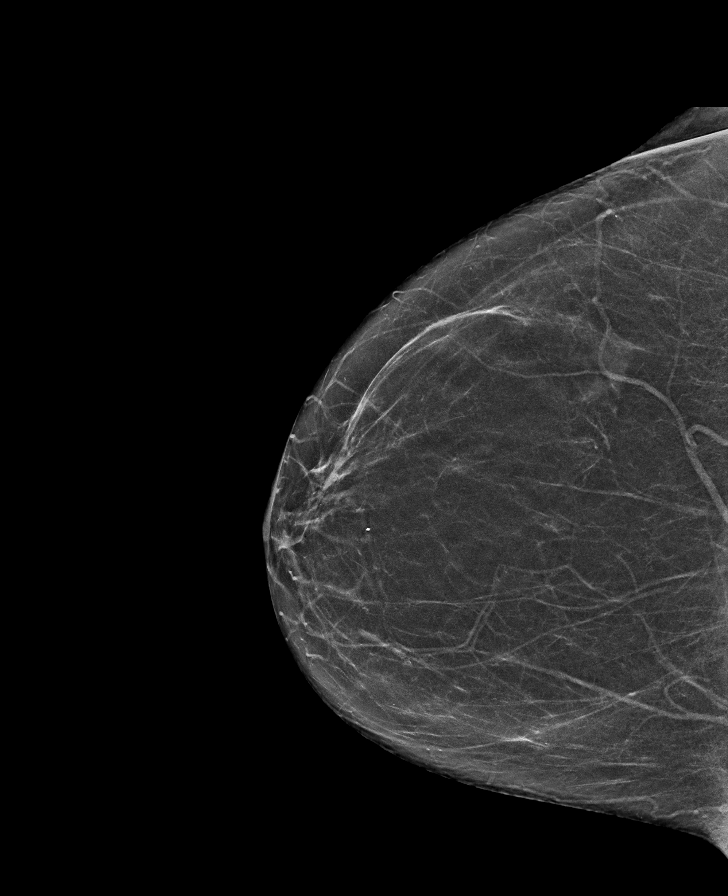

[R MLO synth-2D]
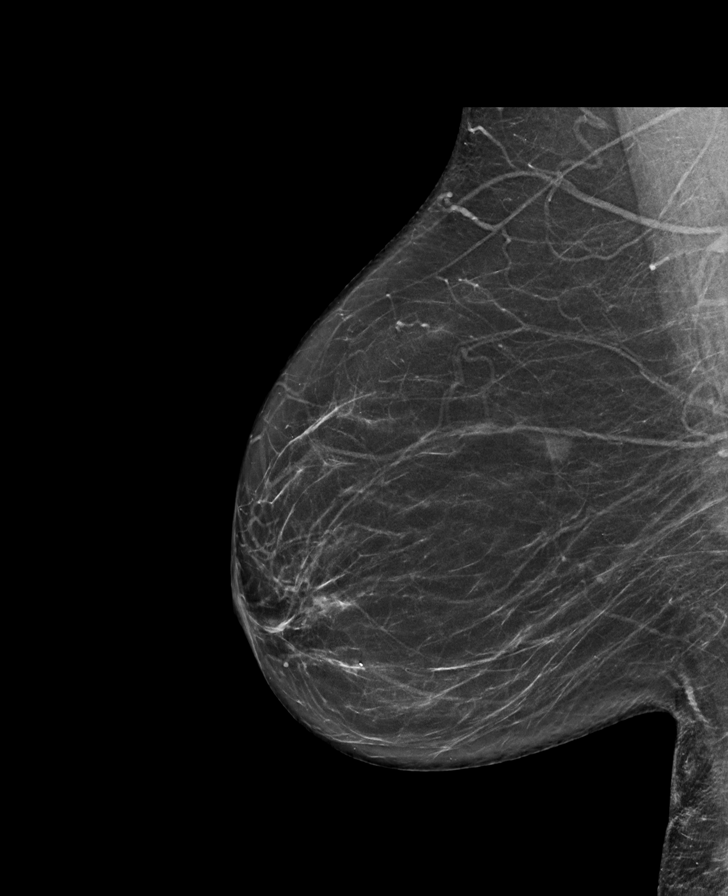

[R MLO tomo · tomo slice 35/68.0]
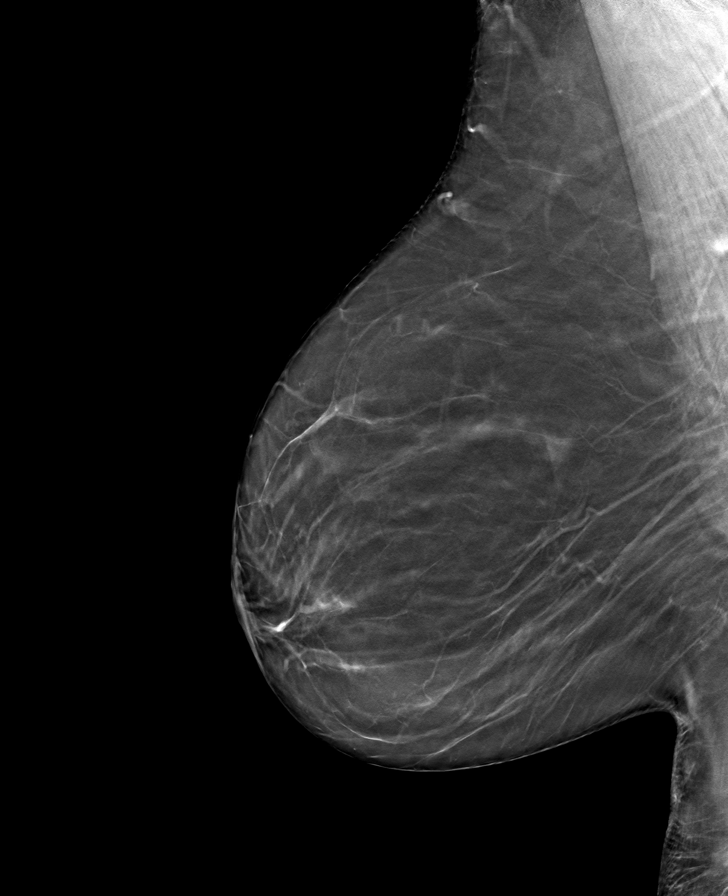

[R CC tomo · tomo slice 31/62.0]
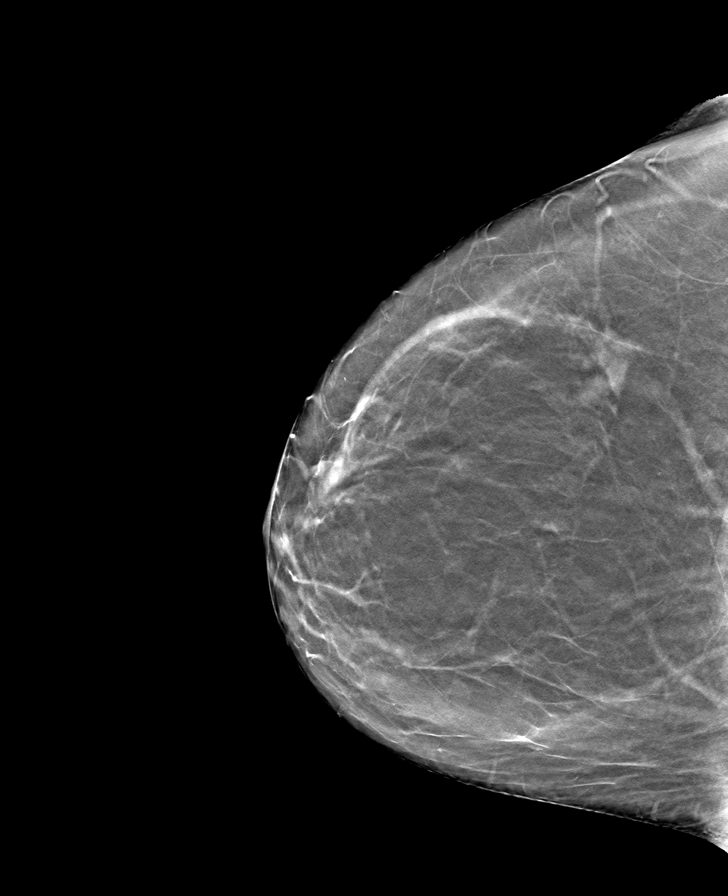

[L MLO tomo · tomo slice 32/63.0]
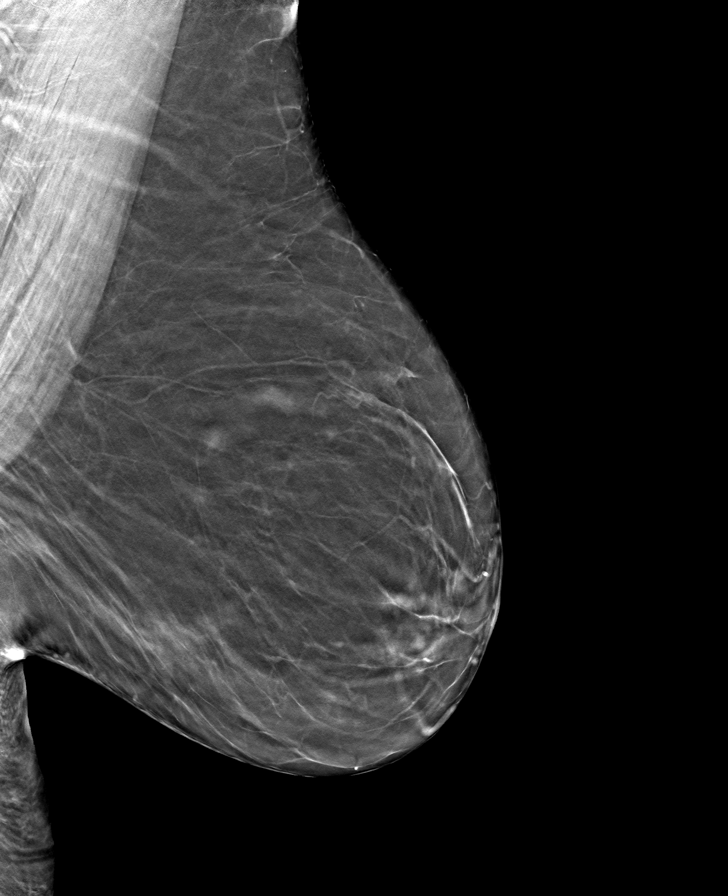

[L CC tomo · tomo slice 30/59.0]
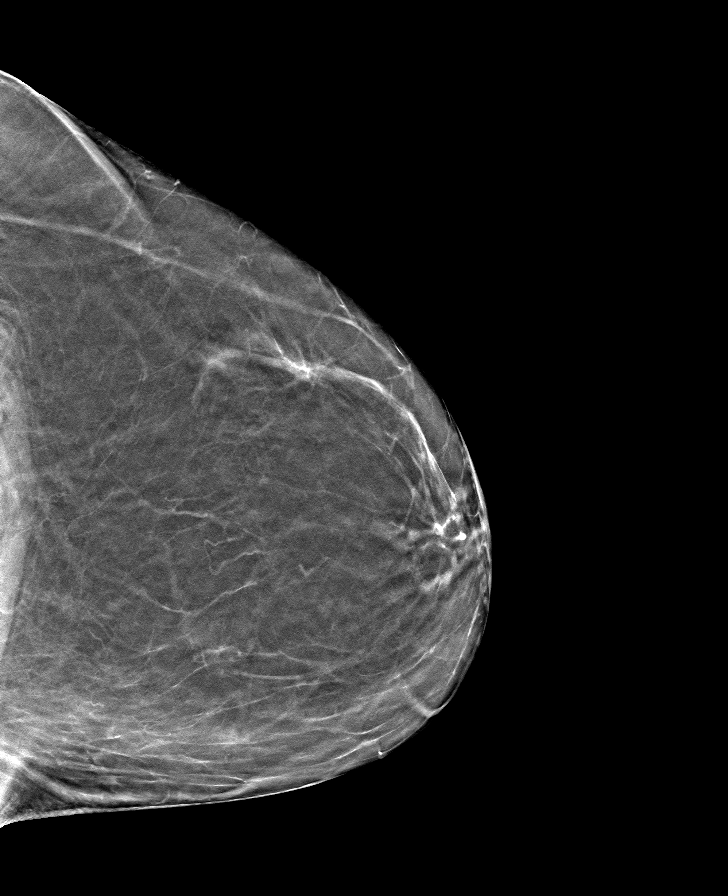

[8 of 24 positions shown; findings below may reference images not displayed]

ACR Breast Density Category b: There are scattered areas of
fibroglandular density.
FINDINGS: In the right breast, a possible asymmetry warrants further
evaluation. In the left breast, no findings suspicious for
malignancy. Images were processed with CAD.
IMPRESSION: Further evaluation is suggested for possible asymmetry in the right
breast.

RECOMMENDATION:
Diagnostic mammogram and possibly ultrasound of the right breast.
(Code:[id number]-55T)

The patient will be contacted regarding the findings, and additional
imaging will be scheduled.

BI-RADS CATEGORY  0: Incomplete. Need additional imaging evaluation
and/or prior mammograms for comparison.

## 2022-02-03 ENCOUNTER — Other Ambulatory Visit: Payer: Self-pay | Admitting: Rheumatology

## 2022-02-03 MED ORDER — HYDROXYCHLOROQUINE SULFATE 200 MG PO TABS: ORAL_TABLET | ORAL | 0 refills | Status: DC

## 2022-02-03 NOTE — Telephone Encounter (Signed)
Patient called requesting prescription refill of Hydroxychloroquine to be sent to [facility] at [address] [facility]    Patient states for the past week her hands have been painful and tight, but not swollen.  Patient did not want to schedule an appointment stating "she didn't think it was necessary since they usually resolve on their own," but requested a return call.

## 2022-02-05 ENCOUNTER — Telehealth: Payer: Self-pay | Admitting: *Deleted

## 2022-02-05 MED ORDER — PREDNISONE 5 MG PO TABS: ORAL_TABLET | ORAL | 0 refills | Status: DC

## 2022-02-05 NOTE — Telephone Encounter (Signed)
Please schedule patient a follow up visit. Patient due August 2023. Thanks!

## 2022-02-05 NOTE — Telephone Encounter (Signed)
Ok to send in prednisone 20 mg tapering by 5 mg every 2 days for the current flare.  If she continues to have recurrent flares she will need to schedule a sooner follow up visit for further evaluation.

## 2022-02-05 NOTE — Telephone Encounter (Signed)
Patient states for the past week her hands have been painful and tight, but not swollen.  Patient did not want to schedule an appointment stating "she didn't think it was necessary since they usually resolve on their own," but requested a return call.

## 2022-02-05 NOTE — Telephone Encounter (Signed)
Verified prescription verbally with Hazel Sams, PA-C and prescription sent to the pharmacy.

## 2022-02-06 NOTE — Telephone Encounter (Signed)
Left message to advise patient we sent in prednisone 20 mg tapering by 5 mg every 2 days for the current flare.  Advised her if she continues to have recurrent flares she will need to schedule a sooner follow up visit for further evaluation.

## 2022-02-16 ENCOUNTER — Other Ambulatory Visit: Payer: Self-pay | Admitting: Rheumatology

## 2022-02-16 ENCOUNTER — Telehealth: Payer: Self-pay | Admitting: Rheumatology

## 2022-02-16 NOTE — Telephone Encounter (Signed)
Patient called, patient did take prednisone taper on 02/05/2022, patient does have appt 02/20/2022 and plans to keep appt, patient requesting prednisone taper sent to below address today. Thank you.

## 2022-02-16 NOTE — Telephone Encounter (Unsigned)
Patient called stating she is experiencing pain and swelling in her left hand.  Patient states she is having difficulty sleeping.  Patient is in [place], [place] and will be there until tomorrow, 02/17/22.  Patient is requesting a prescription of Prednisone to be sent to [facility] at [address] [place], [place] to hold her over until she is back in [place]. Phone [phone] Fax [phone]

## 2022-02-16 NOTE — Telephone Encounter (Signed)
Please clarify if she took the prednisone taper prescribed on 02/05/22? Please also clarify if she plans on keeping her scheduled appointment on 02/20/22?

## 2022-02-16 NOTE — Telephone Encounter (Signed)
Attempted to contact the patient and left message for patient to call the office.  

## 2022-02-16 NOTE — Telephone Encounter (Signed)
I returned patient's call. Patient stated that the recent Prednisone taper was not effective.She stated that her joints are swollen again and requested another taper. Side effects of Prednisone use were discussed with the patient. I called in  a Prednisone taper 5 mg tablets, starting at 20 mg po qd x 4 days and taper by 5 mg po every 4 days at Phone [phone] [facility] at [address] [place], [place] per patient's request. Patient is scheduled to come in the office on 02/20/2022.

## 2022-02-16 NOTE — Progress Notes (Signed)
Office Visit Note  Patient: Jamie Mccall             Date of Birth: 03-08-1956           MRN: [id number]             PCP: Aletha Halim., PA-C Referring: Aletha Halim., PA-C Visit Date: 02/20/2022 Occupation: '@GUAROCC'$ @  Subjective:  Left hand pain and swelling   History of Present Illness: Jamie Mccall is a 66 y.o. female with history of rheumatoid arthritis.  She remains on plaquenil 200 mg 1 tablet by mouth twice daily Monday through Friday.  Patient reports that she continues to tolerate Plaquenil without any side effects and has not missed any doses recently.  She states that she recently traveled to [place] and was under increased stress which she attributes to the recent flare.  She was prescribed a prednisone taper on 02/05/22 and another taper on 02/16/22.  She is currently on prednisone 15 mg daily and will continue to follow taper as prescribed.  She states that the pain and inflammation in her left hand has improved significantly since starting the second prednisone taper.  She has not experienced any other joint pain or joint swelling at this time.  She does not want to discuss additional medication options at this time.  She has declined Biologics in the past and could not tolerate sulfasalazine previously. She has an upcoming appointment with her PCP.  According to the patient she is up-to-date on her Plaquenil eye examination.      Activities of Daily Living:  Patient reports morning stiffness for 0 minutes.   Patient Denies nocturnal pain.  Difficulty dressing/grooming: Denies Difficulty climbing stairs: Denies Difficulty getting out of chair: Denies Difficulty using hands for taps, buttons, cutlery, and/or writing: Denies  Review of Systems  Constitutional:  Negative for fatigue.  HENT:  Negative for mouth sores, mouth dryness and nose dryness.   Eyes:  Negative for pain, visual disturbance and dryness.  Respiratory:  Negative for cough, hemoptysis,  shortness of breath and difficulty breathing.   Cardiovascular:  Negative for chest pain, palpitations, hypertension and swelling in legs/feet.  Gastrointestinal:  Negative for blood in stool, constipation and diarrhea.  Endocrine: Negative for increased urination.  Genitourinary:  Negative for painful urination and involuntary urination.  Musculoskeletal:  Negative for joint pain, joint pain, joint swelling, myalgias, muscle weakness, morning stiffness, muscle tenderness and myalgias.  Skin:  Negative for color change, pallor, rash, hair loss, nodules/bumps, skin tightness, ulcers and sensitivity to sunlight.  Allergic/Immunologic: Negative for susceptible to infections.  Neurological:  Negative for dizziness, numbness, headaches and weakness.  Hematological:  Negative for swollen glands.  Psychiatric/Behavioral:  Negative for depressed mood and sleep disturbance. The patient is not nervous/anxious.     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   Ptosis 07/31/2013   PCO (posterior capsular opacification) 03/13/2013   History of corneal transplant 08/12/2012   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[place]) 05/04/2012   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 Fredna Dow    MASS EXCISION Left 11/05/2016   Procedure: Excision of left foot mass;  Surgeon: Wylene Simmer, MD;  Location: [facility];  Service: Orthopedics;  Laterality: Left;   SQUAMOUS CELL CARCINOMA EXCISION  08/18/2018   right eye   Social History   Social History Narrative   Not on file   Immunization History  Administered Date(s) Administered   Influenza,inj,Quad PF,6+ Mos 08/18/2017, 08/18/2017     Objective: Vital Signs: BP (!) 161/85 (BP Location: Left Arm, Patient Position: Sitting, Cuff Size: Normal)   Pulse 73   Ht 5' 4.5" (1.638 m)   Wt 216 lb 6.4 oz (98.2 kg)   BMI 36.57 kg/m    Physical Exam Vitals and nursing note reviewed.  Constitutional:      Appearance: She is well-developed.  HENT:     Head: Normocephalic and atraumatic.  Eyes:     Conjunctiva/sclera: Conjunctivae normal.  Cardiovascular:     Rate and Rhythm: Normal rate and regular rhythm.     Heart sounds: Murmur heard.  Pulmonary:     Effort: Pulmonary effort is normal.     Breath sounds: Normal breath sounds.  Abdominal:     General: Bowel sounds are normal.     Palpations: Abdomen is soft.  Musculoskeletal:     Cervical back: Normal range of motion.  Skin:    General: Skin is warm and dry.     Capillary Refill: Capillary refill takes less than 2 seconds.  Neurological:     Mental Status: She is alert and oriented to person, place, and time.  Psychiatric:        Behavior: Behavior normal.      Musculoskeletal Exam: C-spine, thoracic spine, and lumbar spine good ROM.  Shoulder joints,  elbow joints, and wrist joints have good ROM. Synovial thickening of MCPs. Synovitis of bilateral 2nd MCP joints. PIP and DIP thickening consistent with OA.  Hip joints, knee joints, and ankle joints have good ROM with no discomfort.  No warmth or effusion of knee joints.  No tenderness or swelling of ankle joints.   CDAI Exam: CDAI Score: 5  Patient Global: 5 mm; Provider Global: 5 mm Swollen: 2 ; Tender: 2  Joint Exam 02/20/2022      Right  Left  MCP 2  Swollen Tender  Swollen Tender     Investigation: No additional findings.  Imaging: No results found.  Recent Labs: Lab Results  Component Value Date   WBC 6.5 09/30/2021   HGB 14.5 09/30/2021   PLT 274 09/30/2021   NA 138 09/30/2021   K 4.6 09/30/2021   CL 103 09/30/2021   CO2 29 09/30/2021   GLUCOSE 85 09/30/2021   BUN 14 09/30/2021  CREATININE 0.62 09/30/2021   BILITOT 0.5 09/30/2021   ALKPHOS 61 10/13/2016   AST 16 09/30/2021   ALT 14 09/30/2021   PROT 6.5 09/30/2021   ALBUMIN 3.7 10/13/2016   CALCIUM 9.0 09/30/2021   GFRAA 108 12/24/2020   QFTBGOLDPLUS NEGATIVE 12/22/2017    Speciality Comments: I called patient regarding PLQ eye exam. shc 02/12/2021  pt states she will not use Enbrel again/ causes Bronchitis,  Procedures:  No procedures performed Allergies: Enbrel [etanercept] and Sulfasalazine   Assessment / Plan:     Visit Diagnoses: Rheumatoid arthritis involving multiple sites with positive rheumatoid factor (HCC) - positive RF, positive anti-CCP, +'14 3 3 '$ eta.  She has severe rheumatoid arthritis involving multiple joints: Patient presents today with ongoing tenderness and synovitis of bilateral second MCP joints.  She has no real thickening of all MCP joints.  Small rheumatoid nodules palpable on extensor surface of both elbows noted.  She is currently taking Plaquenil 200 mg 1 tablet by mouth twice daily Monday through Friday.  She continues to tolerate Plaquenil without any side effects.  She recently  traveled to [place] and was under increased stress during this time.  She feels that the increased risks resulted in a flare.  A prednisone taper was sent to the pharmacy on 02/05/2022 and a repeat taper was sent to the pharmacy on 02/16/2022.  She is currently taking prednisone 15 mg daily and will continue to follow the taper as prescribed.  Her symptoms have improved significantly since initiating the second taper.  The patient has declined more aggressive therapy at this time.  She has declined Biologics in the past and has tried sulfasalazine but to discontinue due to generalized edema.  She does not want to make any medication changes at this time.  She will remain on Plaquenil as prescribed.  She was advised to notify us if she develops any signs or symptoms of a flare.  She will follow-up in the office in 5 months or sooner if needed.  High risk medication use - Plaquenil 200 mg 1 tablet by mouth twice daily Monday through Friday.  Patient has declined the use of other DMARDs or biologic agents.  She previously could not tolerate sulfasalazine due to developing diffuse edema.  CBC and CMP updated on 09/30/21.  Orders for CBC and CMP were released today. Eye exam performed Dr. Susa Simmonds.  We will call to obtain the most recent Plaquenil eye examination results.- Plan: CBC with Differential/Platelet, COMPLETE METABOLIC PANEL WITH GFR No new medical conditions.  She has an upcoming physical with her PCP.  Primary osteoarthritis of both hands: She has PIP and DIP thickening consistent with osteoarthritis of both hands.  Synovial thickening of all MCP joints due to previous damage from rheumatoid arthritis.  She has tenderness and synovitis of bilateral second MCP joints on examination today.  Her symptoms have improved since taking the second prednisone taper prescribed on 02/16/2022.  Other medical conditions are listed as follows:   History of corneal transplant - Followed by ophthalmologist at [facility].   History of hypertension  History of depression  Vitamin D deficiency: She is taking a vitamin D supplement daily.   Other fatigue: Stable.   Postmenopausal - She has had previous DEXA scan which was normal per patient.  Orders: Orders Placed This Encounter  Procedures   CBC with Differential/Platelet   COMPLETE METABOLIC PANEL WITH GFR   No orders of the defined types were placed in this encounter.  Follow-Up Instructions: Return in about 5 months (around 07/23/2022) for Rheumatoid arthritis.   Ofilia Neas, PA-C  Note - This record has been created using Dragon software.  Chart creation errors have been sought, but may not always  have been located. Such creation errors do not reflect on  the standard of medical care.

## 2022-02-20 ENCOUNTER — Ambulatory Visit (INDEPENDENT_AMBULATORY_CARE_PROVIDER_SITE_OTHER): Payer: BC Managed Care – PPO | Admitting: Physician Assistant

## 2022-02-20 ENCOUNTER — Encounter: Payer: Self-pay | Admitting: Physician Assistant

## 2022-02-20 VITALS — BP 161/85 | HR 73 | Ht 64.5 in | Wt 216.4 lb

## 2022-02-20 DIAGNOSIS — Z947 Corneal transplant status: Secondary | ICD-10-CM

## 2022-02-20 DIAGNOSIS — M0579 Rheumatoid arthritis with rheumatoid factor of multiple sites without organ or systems involvement: Secondary | ICD-10-CM | POA: Diagnosis not present

## 2022-02-20 DIAGNOSIS — Z780 Asymptomatic menopausal state: Secondary | ICD-10-CM

## 2022-02-20 DIAGNOSIS — Z79899 Other long term (current) drug therapy: Secondary | ICD-10-CM

## 2022-02-20 DIAGNOSIS — E559 Vitamin D deficiency, unspecified: Secondary | ICD-10-CM

## 2022-02-20 DIAGNOSIS — M19042 Primary osteoarthritis, left hand: Secondary | ICD-10-CM

## 2022-02-20 DIAGNOSIS — Z8659 Personal history of other mental and behavioral disorders: Secondary | ICD-10-CM

## 2022-02-20 DIAGNOSIS — M19041 Primary osteoarthritis, right hand: Secondary | ICD-10-CM | POA: Diagnosis not present

## 2022-02-20 DIAGNOSIS — R5383 Other fatigue: Secondary | ICD-10-CM

## 2022-02-20 DIAGNOSIS — Z8679 Personal history of other diseases of the circulatory system: Secondary | ICD-10-CM

## 2022-02-21 LAB — COMPLETE METABOLIC PANEL WITH GFR
AG Ratio: 1.6 (calc) (ref 1.0–2.5)
ALT: 22 U/L (ref 6–29)
AST: 16 U/L (ref 10–35)
Albumin: 4.2 g/dL (ref 3.6–5.1)
Alkaline phosphatase (APISO): 62 U/L (ref 37–153)
BUN: 13 mg/dL (ref 7–25)
CO2: 28 mmol/L (ref 20–32)
Calcium: 9.5 mg/dL (ref 8.6–10.4)
Chloride: 101 mmol/L (ref 98–110)
Creat: 0.69 mg/dL (ref 0.50–1.05)
Globulin: 2.6 g/dL (calc) (ref 1.9–3.7)
Glucose, Bld: 88 mg/dL (ref 65–99)
Potassium: 4.6 mmol/L (ref 3.5–5.3)
Sodium: 137 mmol/L (ref 135–146)
Total Bilirubin: 0.6 mg/dL (ref 0.2–1.2)
Total Protein: 6.8 g/dL (ref 6.1–8.1)
eGFR: 97 mL/min/{1.73_m2} (ref >=60)

## 2022-02-21 LAB — CBC WITH DIFFERENTIAL/PLATELET
Absolute Monocytes: 567 cells/uL (ref 200–950)
Basophils Absolute: 61 cells/uL (ref 0–200)
Basophils Relative: 1 %
Eosinophils Absolute: 98 cells/uL (ref 15–500)
Eosinophils Relative: 1.6 %
HCT: 43.8 % (ref 35.0–45.0)
Hemoglobin: 14.7 g/dL (ref 11.7–15.5)
Lymphs Abs: 1525 cells/uL (ref 850–3900)
MCH: 33.3 pg — ABNORMAL HIGH (ref 27.0–33.0)
MCHC: 33.6 g/dL (ref 32.0–36.0)
MCV: 99.3 fL (ref 80.0–100.0)
MPV: 10.1 fL (ref 7.5–12.5)
Monocytes Relative: 9.3 %
Neutro Abs: 3849 cells/uL (ref 1500–7800)
Neutrophils Relative %: 63.1 %
Platelets: 263 10*3/uL (ref 140–400)
RBC: 4.41 10*6/uL (ref 3.80–5.10)
RDW: 12 % (ref 11.0–15.0)
Total Lymphocyte: 25 %
WBC: 6.1 10*3/uL (ref 3.8–10.8)

## 2022-02-23 NOTE — Progress Notes (Signed)
CBC and CMP WNL

## 2022-03-12 ENCOUNTER — Telehealth: Payer: Self-pay | Admitting: *Deleted

## 2022-03-12 NOTE — Telephone Encounter (Signed)
Patient called the office and left message sating she is having lower back pain, very low above buttocks. Patient states it has been going on for about 2 weeks. Paitent states it was very painful but is now achy.   Returned call to patient and advised patient to schedule an appointment with Ortho. Patient states she will contact [facility] and schedule an appointment.

## 2022-03-13 ENCOUNTER — Ambulatory Visit: Payer: Self-pay

## 2022-03-13 ENCOUNTER — Other Ambulatory Visit: Payer: Self-pay | Admitting: Physician Assistant

## 2022-03-13 ENCOUNTER — Ambulatory Visit (INDEPENDENT_AMBULATORY_CARE_PROVIDER_SITE_OTHER): Payer: BC Managed Care – PPO | Admitting: Physician Assistant

## 2022-03-13 DIAGNOSIS — M545 Low back pain, unspecified: Secondary | ICD-10-CM | POA: Diagnosis not present

## 2022-03-13 NOTE — Progress Notes (Signed)
Office Visit Note   Patient: Jamie Mccall           Date of Birth: 12/17/1955           MRN: [id number] Visit Date: 03/13/2022              Requested by: Aletha Halim., PA-C [address]  [place] [address] PCP: Aletha Halim., PA-C  Chief Complaint  Patient presents with   Lower Back - Pain      HPI: Patient is a pleasant 66 year old woman with a several month history of low back pain.  She does have a history of rheumatoid arthritis.  She has very focal pain just above her gluteal fold.  She denies any radiation down her legs any paresthesias or weakness.  She is treated this with Advil she thinks it is helped a little bit.  Denies any loss of bowel or bladder control.  She denies any injuries but noticed this first when she was walking on a trip to [place]: Visit Diagnoses:  1. Acute midline low back pain, unspecified whether sciatica present     Plan: I have given her a couple options certainly she can try some physical therapy she does state she her legs feel heavy after walking while which could suggest lumbar stenosis.  This is now been going on for few months and she would like to see if she can get an injection.  We will order an MRI we can call her with the results once this is been completed.  In the meantime I would like for her to stop her Advil and instead we will give her a prescription for meloxicam.  Follow-Up Instructions: No follow-ups on file.   Ortho Exam  Patient is alert, oriented, no adenopathy, well-dressed, normal affect, normal respiratory effort. Examination of her lower back she has focal pain just at the top of the buttocks.  Does not radiate she has 5 out of 5 strength bilaterally with dorsiflexion plantarflexion extension and flexion of her legs her sensation is normal  Imaging: No results found. No images are attached to the encounter.  Labs: Lab Results  Component Value Date   ESRSEDRATE 2  10/29/2020     Lab Results  Component Value Date   ALBUMIN 3.7 10/13/2016    No results found for: "MG" Lab Results  Component Value Date   VD25OH 45 04/25/2021   VD25OH 42 10/29/2020    No results found for: "PREALBUMIN"    Latest Ref Rng & Units 02/20/2022   10:45 AM 09/30/2021    2:40 PM 04/25/2021   12:20 PM  CBC EXTENDED  WBC 3.8 - 10.8 Thousand/uL 6.1  6.5  5.9   RBC 3.80 - 5.10 Million/uL 4.41  4.43  4.47   Hemoglobin 11.7 - 15.5 g/dL 14.7  14.5  14.9   HCT 35.0 - 45.0 % 43.8  43.1  44.2   Platelets 140 - 400 Thousand/uL 263  274  278   NEUT# 1,500 - 7,800 cells/uL 3,849  4,134  3,870   Lymph# 850 - 3,900 cells/uL 1,525  1,580  1,227      There is no height or weight on file to calculate BMI.  Orders:  Orders Placed This Encounter  Procedures   MR Lumbar Spine w/o contrast   Ambulatory referral to Vascular Surgery   No orders of the defined types were placed in this encounter.    Procedures: No procedures performed  Clinical Data: No additional findings.  ROS:  All other systems negative, except as noted in the HPI. Review of Systems  Objective: Vital Signs: There were no vitals taken for this visit.  Specialty Comments:  No specialty comments available.  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   Ptosis 07/31/2013   PCO (posterior capsular opacification) 03/13/2013   History of corneal transplant 08/12/2012   Rheumatoid arthritis  involving multiple sites ([place]) 05/04/2012   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 Fredna Dow    MASS EXCISION Left 11/05/2016   Procedure: Excision of left foot mass;  Surgeon: Wylene Simmer, MD;  Location: [facility];  Service: Orthopedics;  Laterality: Left;   SQUAMOUS CELL CARCINOMA EXCISION  08/18/2018   right eye   Social History   Occupational History   Not on file  Tobacco Use   Smoking status: Every Day    Packs/day: 1.00    Years: 15.00    Total pack years: 15.00    Types: Cigarettes    Passive exposure: Never   Smokeless tobacco: Never  Vaping Use   Vaping Use: Never used  Substance and Sexual Activity   Alcohol use: Yes    Comment: wine daily   Drug use: Never   Sexual activity: Not on file

## 2022-03-20 ENCOUNTER — Ambulatory Visit
Admission: RE | Admit: 2022-03-20 | Discharge: 2022-03-20 | Disposition: A | Discharge status: Home / Self Care | Payer: BC Managed Care – PPO | Source: Ambulatory Visit | Attending: Physician Assistant | Admitting: Physician Assistant

## 2022-03-20 DIAGNOSIS — M545 Low back pain, unspecified: Secondary | ICD-10-CM

## 2022-03-25 ENCOUNTER — Telehealth: Payer: Self-pay | Admitting: Physician Assistant

## 2022-03-25 NOTE — Telephone Encounter (Signed)
Patient had MRI of lumbar spine 03/20/2022

## 2022-03-25 NOTE — Telephone Encounter (Signed)
Pt called requesting a call back. Per PA persons last ov note, will call pt with MRI results. Pt phone number is [phone].

## 2022-03-28 IMAGING — US US BREAST*R* LIMITED INC AXILLA
1 series · 4 of 4 positions shown · non-contrast
Comparison: Previous exam(s).

CLINICAL DATA: Patient recalled from screening for right breast
asymmetry.

EXAM:
DIGITAL DIAGNOSTIC RIGHT MAMMOGRAM WITH CAD AND TOMO
ULTRASOUND RIGHT BREAST

[Series 1: us breast*right* limited inc axilla · 0.07mm/px · 4 of 4 slices shown]
[im 1/4]
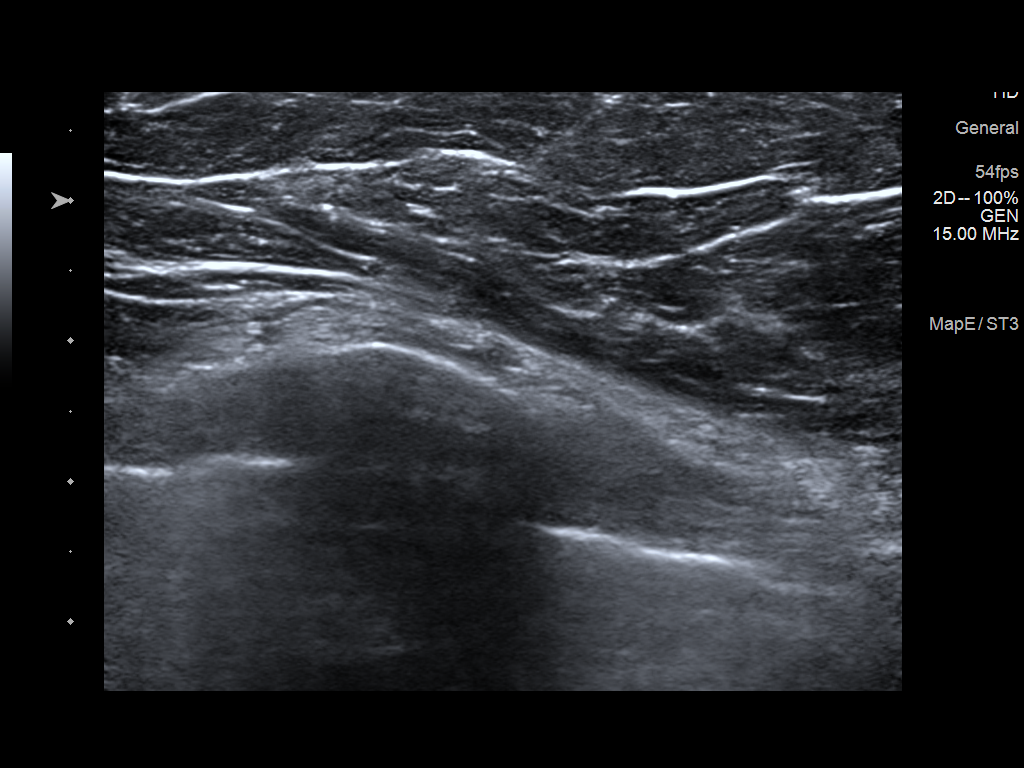
[im 2/4]
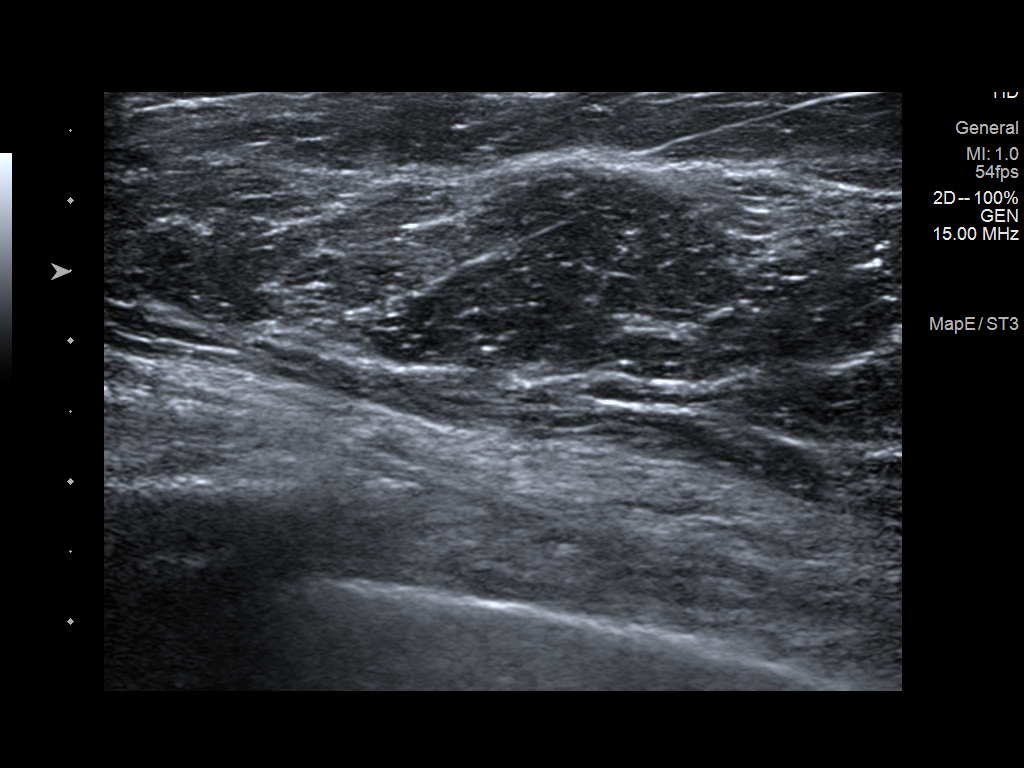
[im 3/4]
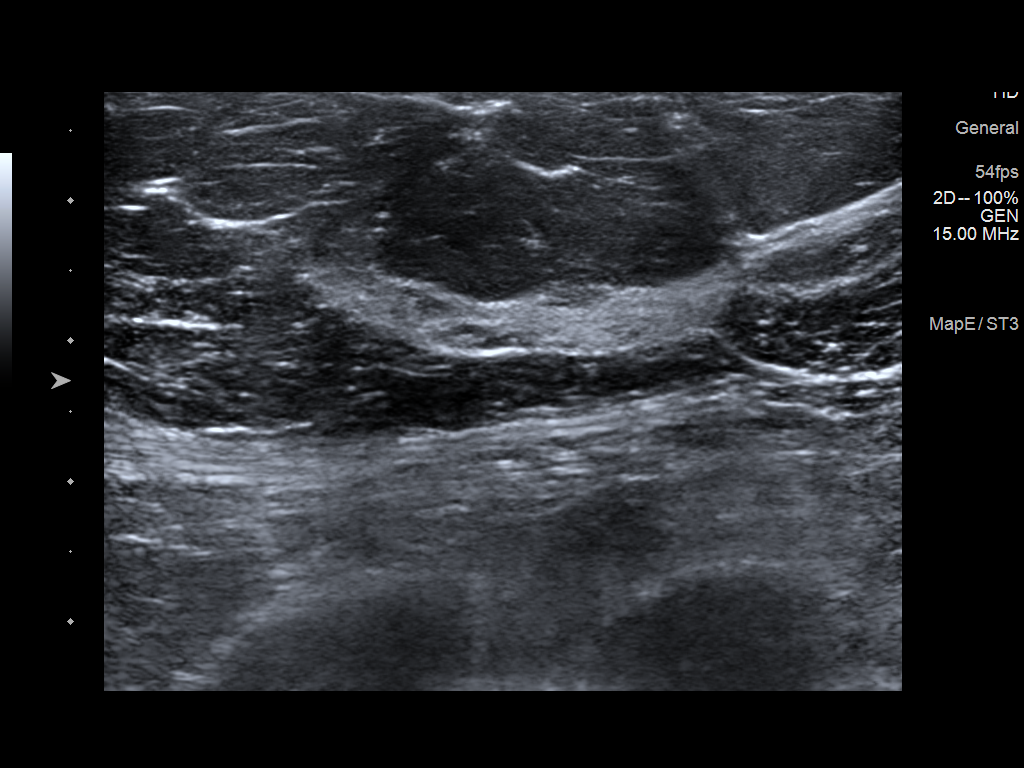
[im 4/4]
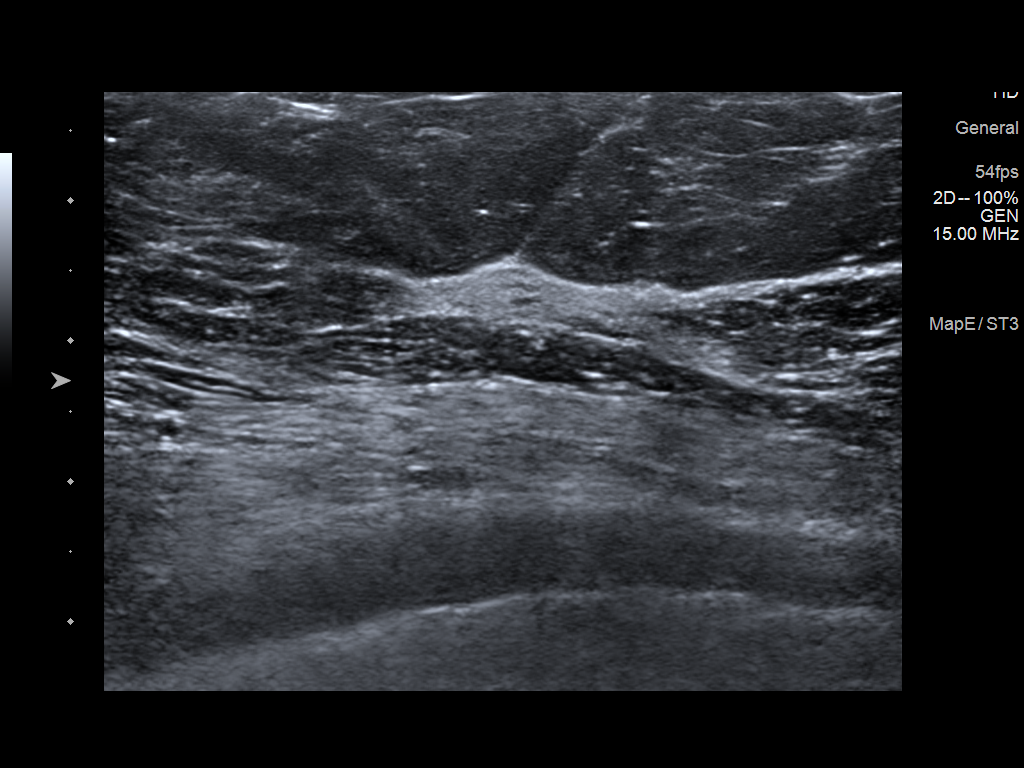

[4 of 4 positions shown; findings below may reference images not displayed]

ACR Breast Density Category b: There are scattered areas of
fibroglandular density.
FINDINGS: There is a persistent focal asymmetry within the far posterior outer
right breast, further evaluated spot compression CC and MLO
tomosynthesis images.

Mammographic images were processed with CAD.

Targeted ultrasound is performed, showing dense tissue without
suspicious mass right breast 10 o'clock position, felt to correspond
with mammographic asymmetry.
IMPRESSION: Probably benign asymmetry within the outer aspect of the right
breast.

RECOMMENDATION:
Right breast mammogram in 6 months to reassess the probably benign
asymmetry.

I have discussed the findings and recommendations with the patient.
If applicable, a reminder letter will be sent to the patient
regarding the next appointment.

BI-RADS CATEGORY  3: Probably benign.

## 2022-03-31 ENCOUNTER — Ambulatory Visit: Payer: BC Managed Care – PPO | Admitting: Physician Assistant

## 2022-04-01 ENCOUNTER — Other Ambulatory Visit: Payer: Self-pay | Admitting: Physician Assistant

## 2022-04-01 DIAGNOSIS — M545 Low back pain, unspecified: Secondary | ICD-10-CM

## 2022-04-03 ENCOUNTER — Telehealth: Payer: Self-pay | Admitting: Physical Medicine and Rehabilitation

## 2022-04-03 NOTE — Telephone Encounter (Signed)
Pt returned call to [facility]. Please call pt to set a referral appt. Pt phone number is [phone].

## 2022-04-15 ENCOUNTER — Telehealth: Payer: Self-pay | Admitting: Physical Medicine and Rehabilitation

## 2022-04-15 NOTE — Telephone Encounter (Signed)
Pt called requesting  to reschedule. Pt states she is not feeling well. Pt phone number is [phone]

## 2022-04-16 ENCOUNTER — Ambulatory Visit: Payer: BC Managed Care – PPO | Admitting: Physical Medicine and Rehabilitation

## 2022-04-16 ENCOUNTER — Telehealth: Payer: Self-pay | Admitting: Physical Medicine and Rehabilitation

## 2022-04-16 NOTE — Telephone Encounter (Signed)
Pt called. She states shena called her and she is returning call. Tried to help pt but she already had appt and nothing else was documented in the chart  CB [phone]

## 2022-04-16 NOTE — Telephone Encounter (Signed)
Pt called requesting a call back from [facility]. Please call pt to set an appt. Pt states she has called a few time and still waiting for call and wanted her account noted. Pt phone number is [phone].

## 2022-04-23 ENCOUNTER — Telehealth: Payer: Self-pay | Admitting: Rheumatology

## 2022-04-23 ENCOUNTER — Encounter: Payer: Self-pay | Admitting: Rheumatology

## 2022-04-23 ENCOUNTER — Ambulatory Visit: Payer: BC Managed Care – PPO | Attending: Rheumatology | Admitting: Rheumatology

## 2022-04-23 VITALS — BP 172/83 | HR 69 | Resp 15 | Ht 64.0 in | Wt 216.2 lb

## 2022-04-23 DIAGNOSIS — E559 Vitamin D deficiency, unspecified: Secondary | ICD-10-CM

## 2022-04-23 DIAGNOSIS — M0579 Rheumatoid arthritis with rheumatoid factor of multiple sites without organ or systems involvement: Secondary | ICD-10-CM

## 2022-04-23 DIAGNOSIS — M19041 Primary osteoarthritis, right hand: Secondary | ICD-10-CM | POA: Diagnosis not present

## 2022-04-23 DIAGNOSIS — Z780 Asymptomatic menopausal state: Secondary | ICD-10-CM

## 2022-04-23 DIAGNOSIS — G5602 Carpal tunnel syndrome, left upper limb: Secondary | ICD-10-CM | POA: Diagnosis not present

## 2022-04-23 DIAGNOSIS — Z79899 Other long term (current) drug therapy: Secondary | ICD-10-CM

## 2022-04-23 DIAGNOSIS — Z947 Corneal transplant status: Secondary | ICD-10-CM

## 2022-04-23 DIAGNOSIS — M19042 Primary osteoarthritis, left hand: Secondary | ICD-10-CM

## 2022-04-23 DIAGNOSIS — M5136 Other intervertebral disc degeneration, lumbar region: Secondary | ICD-10-CM

## 2022-04-23 DIAGNOSIS — Z8679 Personal history of other diseases of the circulatory system: Secondary | ICD-10-CM

## 2022-04-23 DIAGNOSIS — Z8659 Personal history of other mental and behavioral disorders: Secondary | ICD-10-CM

## 2022-04-23 DIAGNOSIS — R5383 Other fatigue: Secondary | ICD-10-CM

## 2022-04-23 MED ORDER — PREDNISONE 5 MG PO TABS: ORAL_TABLET | ORAL | 0 refills | Status: DC

## 2022-04-23 NOTE — Telephone Encounter (Signed)
Please offer an appointment

## 2022-04-23 NOTE — Telephone Encounter (Signed)
Patient called the office stating her left hand is tingly, numb, and aches. Patient states its not swollen. Patient states she would like to know what to do.

## 2022-04-23 NOTE — Progress Notes (Signed)
Office Visit Note  Patient: Jamie Mccall             Date of Birth: [birth year]           MRN: [id number]             PCP: Aletha Halim., PA-C Referring: Aletha Halim., PA-C Visit Date: 04/23/2022 Occupation: '@GUAROCC'$ @  Subjective:  Follow-up (Left hand tingling, numbness, pain. )   History of Present Illness: Jamie Mccall is a 66 y.o. female with history of seropositive rheumatoid arthritis and osteoarthritis.  She states for the last few months she has been having severe pain and discomfort in her left hand.  The pain starts mostly in the middle of the night.  She states she also has discomfort when she is driving.  The pain is throbbing and causes tingling and numbness in her left hand.  She continues to have some swelling in her hands which has not changed.  She continues to have back pain and discomfort.  [facility]  She states she had MRI of the lumbar spine.  She has an appointment with the orthopedics tomorrow.  Activities of Daily Living:  Patient reports morning stiffness for 5 minutes.   Patient Reports nocturnal pain.  Difficulty dressing/grooming: Denies Difficulty climbing stairs: Denies Difficulty getting out of chair: Denies Difficulty using hands for taps, buttons, cutlery, and/or writing: Reports  Review of Systems  Constitutional:  Negative for fatigue.  HENT:  Negative for mouth sores and mouth dryness.   Eyes:  Negative for dryness.  Respiratory:  Negative for shortness of breath.   Cardiovascular:  Negative for chest pain and palpitations.  Gastrointestinal:  Negative for blood in stool, constipation and diarrhea.  Endocrine: Negative for increased urination.  Genitourinary:  Negative for involuntary urination.  Musculoskeletal:  Positive for joint pain, gait problem and joint pain. Negative for joint swelling, myalgias, muscle weakness, morning stiffness, muscle tenderness and myalgias.  Skin:  Negative for color change,  rash, hair loss and sensitivity to sunlight.  Allergic/Immunologic: Negative for susceptible to infections.  Neurological:  Negative for dizziness and headaches.  Hematological:  Negative for swollen glands.  Psychiatric/Behavioral:  Negative for depressed mood and sleep disturbance. The patient is not nervous/anxious.     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   Ptosis 07/31/2013   PCO (posterior capsular opacification) 03/13/2013   History of corneal transplant 08/12/2012   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[place]) 05/04/2012   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 Fredna Dow    MASS EXCISION Left 11/05/2016   Procedure: Excision of left foot mass;  Surgeon: Wylene Simmer, MD;  Location: [place];  Service: Orthopedics;  Laterality: Left;   SQUAMOUS  CELL CARCINOMA EXCISION  08/18/2018   right eye   Social History   Social History Narrative   Not on file   Immunization History  Administered Date(s) Administered   Influenza,inj,Quad PF,6+ Mos 08/18/2017, 08/18/2017     Objective: Vital Signs: BP (!) 172/83 (BP Location: Left Arm, Patient Position: Sitting, Cuff Size: Large)   Pulse 69   Resp 15   Ht '5\' 4"'$  (1.626 m)   Wt 216 lb 3.2 oz (98.1 kg)   BMI 37.11 kg/m    Physical Exam Vitals and nursing note reviewed.  Constitutional:      Appearance: She is well-developed.  HENT:     Head: Normocephalic and atraumatic.  Eyes:     Conjunctiva/sclera: Conjunctivae normal.  Cardiovascular:     Rate and Rhythm: Normal rate and regular rhythm.     Heart sounds: Normal heart sounds.  Pulmonary:     Effort: Pulmonary effort is normal.     Breath sounds: Normal breath sounds.  Abdominal:     General: Bowel sounds are normal.     Palpations: Abdomen is soft.  Musculoskeletal:     Cervical back: Normal range of motion.  Lymphadenopathy:     Cervical: No cervical adenopathy.  Skin:    General: Skin is warm and dry.     Capillary Refill: Capillary refill takes less than 2 seconds.  Neurological:     Mental Status: She is alert and oriented to person, place, and time.  Psychiatric:        Behavior: Behavior normal.      Musculoskeletal Exam: Cervical spine was in good range of motion.  She had painful limited range of motion of the lumbar spine.  Shoulder joints, elbow joints, wrist joints with good range of motion.  She had synovial thickening of bilateral second MCP joints with some synovitis.  PIP and DIP thickening was noted.  Hip joints and knee joints in good range of motion.  She had no tenderness over ankles or MTPs.  CDAI Exam: CDAI Score: 4.8  Patient Global: 4 mm; Provider Global: 4 mm Swollen: 2 ; Tender: 2  Joint Exam 04/23/2022      Right  Left  MCP 2  Swollen Tender  Swollen Tender      Investigation: No additional findings.  Imaging: No results found.  Recent Labs: Lab Results  Component Value Date   WBC 6.1 02/20/2022   HGB 14.7 02/20/2022   PLT 263 02/20/2022   NA 137 02/20/2022   K 4.6 02/20/2022   CL 101 02/20/2022   CO2 28 02/20/2022   GLUCOSE 88 02/20/2022   BUN 13 02/20/2022   CREATININE 0.69 02/20/2022   BILITOT 0.6 02/20/2022   ALKPHOS 61 10/13/2016   AST 16 02/20/2022   ALT 22 02/20/2022   PROT 6.8 02/20/2022   ALBUMIN 3.7 10/13/2016   CALCIUM 9.5 02/20/2022   GFRAA 108 12/24/2020   QFTBGOLDPLUS NEGATIVE 12/22/2017    Speciality Comments: I called patient regarding PLQ eye exam. shc 02/12/2021  pt states she  will not use Enbrel again/ causes Bronchitis,  Procedures:  No procedures performed Allergies: Enbrel [etanercept] and Sulfasalazine   Assessment / Plan:     Visit Diagnoses: Rheumatoid arthritis involving multiple sites with positive rheumatoid factor (HCC) - positive RF, positive anti-CCP, +'14 3 3 '$ eta.  She has synovial thickening over bilateral second MCP joints with synovitis.  She continues to have pain and discomfort in her hands.  She does not want more aggressive therapy.  She has been taking hydroxychloroquine on a regular basis.  High risk medication use - Plaquenil 200 mg 1 tablet by mouth twice daily Monday through Friday. Patient has declined the use of other DMARDs or biologic agents.   Carpal tunnel syndrome of left wrist-she complains of tingling and numbness in her left hand especially at nighttime and when she is driving the car.  Clinical findings are consistent with carpal tunnel syndrome.  Detailed counseling on carpal tunnel syndrome was provided.  Different treatment options were discussed.  Patient does not want to have nerve conduction velocity.  We will try a carpal tunnel brace for now.  If she has an adequate response we can consider ultrasound-guided carpal tunnel injection.  She also requested a prednisone  taper.  Side effects of prednisone including weight gain, elevated blood pressure, elevated blood glucose, asked to process, obesity and heart disease were discussed.  She was placed on prednisone 20 mg p.o. daily and taper by 5 mg every 2 days.  Primary osteoarthritis of both hands-she is osteoarthritis in bilateral hands with bilateral PIP and DIP thickening.  DDD lumbar spine-she has been experiencing pain and discomfort in her lower back with the left-sided radiculopathy.  She had MRI of the lumbar spine on March 20, 2022.  MRI findings were discussed with the patient.  She has an appointment coming up with [facility].  She may benefit from injection.  I also discussed physical therapy.  She plans to discuss this further with Dr. Care.  History of corneal transplant - Followed by ophthalmologist at [facility].   History of hypertension-blood pressure was elevated today.  I advised her to monitor blood pressure closely and follow-up with the PCP.  History of depression  Vitamin D deficiency  Other fatigue  Postmenopausal - She has had previous DEXA scan which was normal per patient.  Orders: No orders of the defined types were placed in this encounter.  Meds ordered this encounter  Medications   predniSONE (DELTASONE) 5 MG tablet    Sig: Take 4 tabs po qd x 2 days, 3  tabs po qd x 2 days, 2  tabs po qd x 2 days, 1  tab po qd x 2 days    Dispense:  20 tablet    Refill:  0     Follow-Up Instructions: Return for Rheumatoid arthritis, Osteoarthritis.   Bo Merino, MD  Note - This record has been created using Editor, commissioning.  Chart creation errors have been sought, but may not always  have been located. Such creation errors do not reflect on  the standard of medical care.

## 2022-04-23 NOTE — Telephone Encounter (Signed)
Schedule patient an appointment for 04/23/2022 at 2:40 pm.

## 2022-04-23 NOTE — Telephone Encounter (Signed)
Patient states she is having a flare in her left hand only. Patient states the left hand is tingly, numb, aches and is stiff. Patient states that the pain in interrupting her sleep. Patient states it was throbbing last night. Patient states there is no swelling. Patient states she is taking PLQ as prescribed. Patient states she is also taking natural anti-inflammatories. Patient states when she is able to move around more then pain in her hands decreases. Patient states she did not have any injury. Patient states she has been working on stopping smoking. Please advise.

## 2022-04-24 ENCOUNTER — Encounter: Payer: Self-pay | Admitting: Physical Medicine and Rehabilitation

## 2022-04-24 ENCOUNTER — Ambulatory Visit (INDEPENDENT_AMBULATORY_CARE_PROVIDER_SITE_OTHER): Payer: BC Managed Care – PPO | Admitting: Physical Medicine and Rehabilitation

## 2022-04-24 VITALS — BP 143/82 | HR 73

## 2022-04-24 DIAGNOSIS — M545 Low back pain, unspecified: Secondary | ICD-10-CM

## 2022-04-24 DIAGNOSIS — M47816 Spondylosis without myelopathy or radiculopathy, lumbar region: Secondary | ICD-10-CM

## 2022-04-24 DIAGNOSIS — G8929 Other chronic pain: Secondary | ICD-10-CM | POA: Diagnosis not present

## 2022-04-24 NOTE — Progress Notes (Unsigned)
Jamie Mccall - 66 y.o. female MRN [id number]  Date of birth: Mar 30, 1956  Office Visit Note: Visit Date: 04/24/2022 PCP: Aletha Halim., PA-C Referred by: Aletha Halim., PA-C  Subjective: Chief Complaint  Patient presents with   Lower Back - Pain   HPI: Jamie Mccall is a 66 y.o. female who comes in today per the request of [place], [place] for evaluation of chronic, worsening and severe bilateral lower back pain, left greater than right. Pain ongoing for several months and is exacerbated by standing, describes as a sore and aching sensation, currently rates as 7 out of 10. Some relief with home exercise regimen, rest and use of medications/topical pain creams. Recent lumbar MRI imaging exhibits multilevel facet hypertrophy and spondylosis at L3-L4 with endplate depression and edema in the superior endplate of L4, suspicious for a subacute compression deformity. No high grade spinal canal stenosis noted. No history of lumbar surgery/injections. Patient states pain is severe with standing while performing household tasks. States pain is negatively impacting her daily life. Patient does have history of rheumatoid arthritis, currently being treated by Dr. Bo Merino at [facility]. Patient denies focal weakness, numbness and tingling. Patient denies recent trauma or falls.    Oswestry Disability Index Score 32% 10 to 20 (40%) moderate disability: The patient experiences more pain and difficulty with sitting, lifting and standing. Travel and social life are more difficult, and they may be disabled from work. Personal care, sexual activity and sleeping are not grossly affected, and the patient can usually be managed by conservative means.  Review of Systems  Musculoskeletal:  Positive for back pain.  Neurological:  Negative for tingling, sensory change, focal weakness and weakness.  All other systems reviewed and are negative.  Otherwise per  HPI.  Assessment & Plan: Visit Diagnoses:    ICD-10-CM   1. Chronic bilateral low back pain without sciatica  M54.50 Ambulatory referral to Physical Medicine Rehab   G89.29     2. Spondylosis without myelopathy or radiculopathy, lumbar region  M47.816 Ambulatory referral to Physical Medicine Rehab    3. Facet hypertrophy of lumbar region  M47.816 Ambulatory referral to Physical Medicine Rehab       Plan: Findings:  Chronic, worsening and severe bilateral axial back pain, left greater than right.  Patient continues to have severe pain despite good conservative therapy such as home exercise regimen, rest and use of medications.  Patient's clinical presentation and exam are consistent with facet mediated pain.  She does have significant pain when moving from a sitting to standing position.  Next step is to perform diagnostic and hopefully therapeutic bilateral L4-L5 and L5-S1 facet joint injections under fluoroscopic guidance.  If patient gets good relief with diagnostic facet blocks we did discuss the possibility of longer sustained pain relief with radiofrequency ablation.    Meds & Orders: No orders of the defined types were placed in this encounter.   Orders Placed This Encounter  Procedures   Ambulatory referral to Physical Medicine Rehab    Follow-up: Return for Bilateral L4-L5 and L5-S1 facet joint/medial branch blocks.   Procedures: No procedures performed      Clinical History: EXAM: MRI LUMBAR SPINE WITHOUT CONTRAST   TECHNIQUE: Multiplanar, multisequence MR imaging of the lumbar spine was performed. No intravenous contrast was administered.   COMPARISON:  Radiographs 03/13/2022   FINDINGS: Segmentation: Conventional anatomy assumed, with the last open disc space designated L5-S1.   Alignment: Minimal scoliosis and minimal degenerative anterolisthesis  at L3-4.   Vertebrae: Prominent endplate degenerative changes at L3-4 with superior endplate compression  deformity and marrow edema at L4. There is mild endplate edema within the inferior endplate of L3 asymmetric to the right. No evidence of L3-4 discitis. No other acute or significant osseous findings. The visualized sacroiliac joints appear unremarkable.   Conus medullaris: Extends to the L1 level and appears normal.   Paraspinal and other soft tissues: No significant paraspinal findings.   Disc levels:   Sagittal images demonstrate no significant disc space findings from T10-11 through T12-L1.   L1-2: Loss of disc height with mild disc bulging and anterior osteophyte formation. No spinal stenosis or nerve root encroachment.   L2-3: Mild loss of disc height with annular disc bulging eccentric to the left. Mild inferior left foraminal narrowing with possible extraforaminal left L2 nerve root encroachment. The spinal canal and lateral recesses are patent.   L3-4: Moderate loss of disc height with annular disc bulging. As above, depression and marrow edema within the superior endplate of L4 which could reflect a subacute fracture. Mild facet and ligamentous hypertrophy. These factors contribute to mild spinal stenosis with mild narrowing of the foramina and right lateral recess.   L4-5: Preserved disc height with mild disc bulging and endplate osteophytes asymmetric to the right. Mild facet and ligamentous hypertrophy. Asymmetric right foraminal narrowing with possible extraforaminal right L4 nerve root encroachment. The spinal canal, lateral recesses and left foramen are widely patent.   L5-S1: Disc height and hydration are maintained. Mild endplate osteophyte formation asymmetric to the right and mild facet hypertrophy. No significant spinal stenosis or nerve root encroachment.   IMPRESSION: 1. Spondylosis at L3-4 with endplate depression and edema in the superior endplate of L4, suspicious for a subacute compression deformity. This may contribute to back pain although is  not associated with any clear left-sided nerve root encroachment. 2. Eccentric disc bulging on the left at L2-3 causes inferior left foraminal narrowing and possible extraforaminal left L2 nerve root encroachment. 3. Asymmetric right foraminal narrowing at L4-5 with possible extraforaminal right L4 nerve root encroachment. 4. No high-grade spinal stenosis.     Electronically Signed   By: Richardean Sale M.D.   On: 03/23/2022 10:20   She reports that she quit smoking 11 days ago. Her smoking use included cigarettes. She has a 15.00 pack-year smoking history. She has never been exposed to tobacco smoke. She has never used smokeless tobacco. No results for input(s): "HGBA1C", "LABURIC" in the last 8760 hours.  Objective:  VS:  HT:    WT:   BMI:     BP:(!) 143/82  HR:73bpm  TEMP: ( )  RESP:  Physical Exam Vitals and nursing note reviewed.  HENT:     Head: Normocephalic and atraumatic.     Right Ear: External ear normal.     Left Ear: External ear normal.     Nose: Nose normal.     Mouth/Throat:     Mouth: Mucous membranes are moist.  Eyes:     Extraocular Movements: Extraocular movements intact.  Cardiovascular:     Rate and Rhythm: Normal rate.     Pulses: Normal pulses.  Pulmonary:     Effort: Pulmonary effort is normal.  Abdominal:     General: Abdomen is flat. There is no distension.  Musculoskeletal:        General: Tenderness present.     Cervical back: Normal range of motion.     Comments: Pt rises from seated  position to standing without difficulty. Concordant low back pain with facet loading, lumbar spine extension and rotation. Strong distal strength without clonus, no pain upon palpation of greater trochanters. Sensation intact bilaterally. Walks independently, gait steady.   Skin:    General: Skin is warm and dry.     Capillary Refill: Capillary refill takes less than 2 seconds.  Neurological:     General: No focal deficit present.     Mental Status: She is  alert and oriented to person, place, and time.  Psychiatric:        Mood and Affect: Mood normal.        Behavior: Behavior normal.     Ortho Exam  Imaging: No results found.  Past Medical/Family/Surgical/Social History: Medications & Allergies reviewed per EMR, new medications updated. 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   Ptosis 07/31/2013   PCO (posterior capsular opacification) 03/13/2013   History of corneal transplant 08/12/2012   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[place]) 05/04/2012   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 Fredna Dow    MASS EXCISION Left 11/05/2016   Procedure: Excision of left foot mass;  Surgeon: Wylene Simmer, MD;  Location: [place];  Service: Orthopedics;  Laterality: Left;   SQUAMOUS CELL CARCINOMA EXCISION  08/18/2018   right eye    Social History   Occupational History   Not on file  Tobacco Use   Smoking status: Former    Packs/day: 1.00    Years: 15.00    Total pack years: 15.00    Types: Cigarettes    Quit date: 04/13/2022    Years since quitting: 0.0    Passive exposure: Never   Smokeless tobacco: Never  Vaping Use   Vaping Use: Never used  Substance and Sexual Activity   Alcohol use: Yes    Comment: wine daily   Drug use: Never   Sexual activity: Not on file

## 2022-04-24 NOTE — Progress Notes (Unsigned)
Pt states pain for months. Sometimes bothers her at night. Bothers her when she walks. Some days are worse than others. Pt states is having lbp. More on left sides. Weakness in middle of lb. No leg pain or numbness or tingling. Tried icey hot and advil. No injections or PT

## 2022-04-25 ENCOUNTER — Other Ambulatory Visit: Payer: Self-pay | Admitting: Physician Assistant

## 2022-04-27 NOTE — Telephone Encounter (Signed)
Next Visit: 08/28/2022  Last Visit: 04/23/2022  Labs: 02/20/2022 CBC and CMP WNL.  Eye exam: 03/09/2022 (Faxed PLQ eye exam form to eye doctor to complete.)   Current Dose per office note 04/23/2022: Plaquenil 200 mg 1 tablet by mouth twice daily Monday through Friday  NG:[id number] arthritis involving multiple sites with positive rheumatoid factor   Last Fill: 02/03/2022  Okay to refill Plaquenil?

## 2022-05-07 ENCOUNTER — Telehealth: Payer: Self-pay | Admitting: Physical Medicine and Rehabilitation

## 2022-05-07 NOTE — Telephone Encounter (Signed)
IC patient will keep appt

## 2022-05-07 NOTE — Telephone Encounter (Signed)
Patient returned call asked for a call back concerning the appointment that was made for her.    The number to contact patient is  [phone].   Patient said she will keep her phone with her for the call back.

## 2022-05-12 ENCOUNTER — Ambulatory Visit (INDEPENDENT_AMBULATORY_CARE_PROVIDER_SITE_OTHER): Payer: BC Managed Care – PPO | Admitting: Physical Medicine and Rehabilitation

## 2022-05-12 ENCOUNTER — Ambulatory Visit: Payer: Self-pay

## 2022-05-12 VITALS — BP 178/82 | HR 96

## 2022-05-12 DIAGNOSIS — M47816 Spondylosis without myelopathy or radiculopathy, lumbar region: Secondary | ICD-10-CM

## 2022-05-12 MED ORDER — CYCLOBENZAPRINE HCL 10 MG PO TABS: 10.0000 mg | ORAL_TABLET | Freq: Three times a day (TID) | ORAL | 0 refills | Status: DC | PRN

## 2022-05-12 MED ORDER — BUPIVACAINE HCL 0.5 % IJ SOLN
3.0000 mL | Freq: Once | INTRAMUSCULAR | Status: AC
  Administered 2022-05-12: 3 mL

## 2022-05-12 NOTE — Patient Instructions (Signed)

## 2022-05-12 NOTE — Progress Notes (Signed)
Numeric Pain Rating Scale and Functional Assessment Average Pain 5   In the last MONTH (on 0-10 scale) has pain interfered with the following?  1. General activity like being  able to carry out your everyday physical activities such as walking, climbing stairs, carrying groceries, or moving a chair?  Rating(10)   +Driver, -BT, -Dye Allergies.   New pain in the left side of mid-to-lower back x 5 days, after a twisting motion while getting off of a stool in her kitchen. Not able to sleep in her bed. This new pain is a level 10.

## 2022-05-19 NOTE — Progress Notes (Signed)
Jamie Mccall - 66 y.o. female MRN [id number]  Date of birth: 08/02/1956  Office Visit Note: Visit Date: 05/12/2022 PCP: Aletha Halim., PA-C Referred by: Aletha Halim., PA-C  Subjective: Chief Complaint  Patient presents with   Lower Back - Pain   HPI:  Jamie Mccall is a 66 y.o. female who comes in today at the request of Barnet Pall, FNP for planned Bilateral  L4-5 and L5-S1 Lumbar facet/medial branch block with fluoroscopic guidance.  The patient has failed conservative care including home exercise, medications, time and activity modification.  This injection will be diagnostic and hopefully therapeutic.  Please see requesting physician notes for further details and justification.  Exam has shown concordant pain with facet joint loading.   ROS Otherwise per HPI.  Assessment & Plan: Visit Diagnoses:    ICD-10-CM   1. Spondylosis without myelopathy or radiculopathy, lumbar region  M47.816 XR C-ARM NO REPORT    Facet Injection    bupivacaine (MARCAINE) 0.5 % (with pres) injection 3 mL      Plan: No additional findings.   Meds & Orders:  Meds ordered this encounter  Medications   bupivacaine (MARCAINE) 0.5 % (with pres) injection 3 mL   cyclobenzaprine (FLEXERIL) 10 MG tablet    Sig: Take 1 tablet (10 mg total) by mouth 3 (three) times daily as needed for muscle spasms.    Dispense:  30 tablet    Refill:  0    Orders Placed This Encounter  Procedures   Facet Injection   XR C-ARM NO REPORT    Follow-up: Return for Review Pain Diary.   Procedures: No procedures performed  Lumbar Diagnostic Facet Joint Nerve Block with Fluoroscopic Guidance   Patient: Jamie Mccall      Date of Birth: 1956-08-09 MRN: [id number] PCP: Aletha Halim., PA-C      Visit Date: 05/12/2022   Universal Protocol:    Date/Time: 10/10/239:41 AM  Consent Given By: the patient  Position: PRONE  Additional Comments: Vital signs were monitored before and after the  procedure. Patient was prepped and draped in the usual sterile fashion. The correct patient, procedure, and site was verified.   Injection Procedure Details:   Procedure diagnoses:  1. Spondylosis without myelopathy or radiculopathy, lumbar region      Meds Administered:  Meds ordered this encounter  Medications   bupivacaine (MARCAINE) 0.5 % (with pres) injection 3 mL   cyclobenzaprine (FLEXERIL) 10 MG tablet    Sig: Take 1 tablet (10 mg total) by mouth 3 (three) times daily as needed for muscle spasms.    Dispense:  30 tablet    Refill:  0     Laterality: Bilateral  Location/Site: L4-L5, L3 and L4 medial branches and L5-S1, L4 medial branch and L5 dorsal ramus  Needle: 5.0 in., 25 ga.  Short bevel or Quincke spinal needle  Needle Placement: Oblique pedical  Findings:   -Comments: There was excellent flow of contrast along the articular pillars without intravascular flow.  Procedure Details: The fluoroscope beam is vertically oriented in AP and then obliqued 15 to 20 degrees to the ipsilateral side of the desired nerve to achieve the "Scotty dog" appearance.  The skin over the target area of the junction of the superior articulating process and the transverse process (sacral ala if blocking the L5 dorsal rami) was locally anesthetized with a 1 ml volume of 1% Lidocaine without Epinephrine.  The spinal needle was inserted and advanced in a trajectory view down  to the target.   After contact with periosteum and negative aspirate for blood and CSF, correct placement without intravascular or epidural spread was confirmed by injecting 0.5 ml. of Isovue-250.  A spot radiograph was obtained of this image.    Next, a 0.5 ml. volume of the injectate described above was injected. The needle was then redirected to the other facet joint nerves mentioned above if needed.  Prior to the procedure, the patient was given a Pain Diary which was completed for baseline measurements.  After the  procedure, the patient rated their pain every 30 minutes and will continue rating at this frequency for a total of 5 hours.  The patient has been asked to complete the Diary and return to [place] by mail, fax or hand delivered as soon as possible.   Additional Comments:  The patient tolerated the procedure well Dressing: 2 x 2 sterile gauze and Band-Aid    Post-procedure details: Patient was observed during the procedure. Post-procedure instructions were reviewed.  Patient left the clinic in stable condition.   Clinical History: EXAM: MRI LUMBAR SPINE WITHOUT CONTRAST   TECHNIQUE: Multiplanar, multisequence MR imaging of the lumbar spine was performed. No intravenous contrast was administered.   COMPARISON:  Radiographs 03/13/2022   FINDINGS: Segmentation: Conventional anatomy assumed, with the last open disc space designated L5-S1.   Alignment: Minimal scoliosis and minimal degenerative anterolisthesis at L3-4.   Vertebrae: Prominent endplate degenerative changes at L3-4 with superior endplate compression deformity and marrow edema at L4. There is mild endplate edema within the inferior endplate of L3 asymmetric to the right. No evidence of L3-4 discitis. No other acute or significant osseous findings. The visualized sacroiliac joints appear unremarkable.   Conus medullaris: Extends to the L1 level and appears normal.   Paraspinal and other soft tissues: No significant paraspinal findings.   Disc levels:   Sagittal images demonstrate no significant disc space findings from T10-11 through T12-L1.   L1-2: Loss of disc height with mild disc bulging and anterior osteophyte formation. No spinal stenosis or nerve root encroachment.   L2-3: Mild loss of disc height with annular disc bulging eccentric to the left. Mild inferior left foraminal narrowing with possible extraforaminal left L2 nerve root encroachment. The spinal canal and lateral recesses are patent.   L3-4:  Moderate loss of disc height with annular disc bulging. As above, depression and marrow edema within the superior endplate of L4 which could reflect a subacute fracture. Mild facet and ligamentous hypertrophy. These factors contribute to mild spinal stenosis with mild narrowing of the foramina and right lateral recess.   L4-5: Preserved disc height with mild disc bulging and endplate osteophytes asymmetric to the right. Mild facet and ligamentous hypertrophy. Asymmetric right foraminal narrowing with possible extraforaminal right L4 nerve root encroachment. The spinal canal, lateral recesses and left foramen are widely patent.   L5-S1: Disc height and hydration are maintained. Mild endplate osteophyte formation asymmetric to the right and mild facet hypertrophy. No significant spinal stenosis or nerve root encroachment.   IMPRESSION: 1. Spondylosis at L3-4 with endplate depression and edema in the superior endplate of L4, suspicious for a subacute compression deformity. This may contribute to back pain although is not associated with any clear left-sided nerve root encroachment. 2. Eccentric disc bulging on the left at L2-3 causes inferior left foraminal narrowing and possible extraforaminal left L2 nerve root encroachment. 3. Asymmetric right foraminal narrowing at L4-5 with possible extraforaminal right L4 nerve root encroachment. 4. No high-grade  spinal stenosis.     Electronically Signed   By: Richardean Sale M.D.   On: 03/23/2022 10:20     Objective:  VS:  HT:    WT:   BMI:     BP:(!) 178/82  HR:96bpm  TEMP: ( )  RESP:95 % Physical Exam Vitals and nursing note reviewed.  Constitutional:      General: She is not in acute distress.    Appearance: Normal appearance. She is not ill-appearing.  HENT:     Head: Normocephalic and atraumatic.     Right Ear: External ear normal.     Left Ear: External ear normal.  Eyes:     Extraocular Movements: Extraocular  movements intact.  Cardiovascular:     Rate and Rhythm: Normal rate.     Pulses: Normal pulses.  Pulmonary:     Effort: Pulmonary effort is normal. No respiratory distress.  Abdominal:     General: There is no distension.     Palpations: Abdomen is soft.  Musculoskeletal:        General: Tenderness present.     Cervical back: Neck supple.     Right lower leg: No edema.     Left lower leg: No edema.     Comments: Patient has good distal strength with no pain over the greater trochanters.  No clonus or focal weakness.  Skin:    Findings: No erythema, lesion or rash.  Neurological:     General: No focal deficit present.     Mental Status: She is alert and oriented to person, place, and time.     Sensory: No sensory deficit.     Motor: No weakness or abnormal muscle tone.     Coordination: Coordination normal.  Psychiatric:        Mood and Affect: Mood normal.        Behavior: Behavior normal.      Imaging: No results found.

## 2022-05-19 NOTE — Procedures (Signed)
Lumbar Diagnostic Facet Joint Nerve Block with Fluoroscopic Guidance   Patient: Jamie Mccall      Date of Birth: 1956/01/15 MRN: [id number] PCP: Aletha Halim., PA-C      Visit Date: 05/12/2022   Universal Protocol:    Date/Time: 10/10/239:41 AM  Consent Given By: the patient  Position: PRONE  Additional Comments: Vital signs were monitored before and after the procedure. Patient was prepped and draped in the usual sterile fashion. The correct patient, procedure, and site was verified.   Injection Procedure Details:   Procedure diagnoses:  1. Spondylosis without myelopathy or radiculopathy, lumbar region      Meds Administered:  Meds ordered this encounter  Medications   bupivacaine (MARCAINE) 0.5 % (with pres) injection 3 mL   cyclobenzaprine (FLEXERIL) 10 MG tablet    Sig: Take 1 tablet (10 mg total) by mouth 3 (three) times daily as needed for muscle spasms.    Dispense:  30 tablet    Refill:  0     Laterality: Bilateral  Location/Site: L4-L5, L3 and L4 medial branches and L5-S1, L4 medial branch and L5 dorsal ramus  Needle: 5.0 in., 25 ga.  Short bevel or Quincke spinal needle  Needle Placement: Oblique pedical  Findings:   -Comments: There was excellent flow of contrast along the articular pillars without intravascular flow.  Procedure Details: The fluoroscope beam is vertically oriented in AP and then obliqued 15 to 20 degrees to the ipsilateral side of the desired nerve to achieve the "Scotty dog" appearance.  The skin over the target area of the junction of the superior articulating process and the transverse process (sacral ala if blocking the L5 dorsal rami) was locally anesthetized with a 1 ml volume of 1% Lidocaine without Epinephrine.  The spinal needle was inserted and advanced in a trajectory view down to the target.   After contact with periosteum and negative aspirate for blood and CSF, correct placement without intravascular or epidural  spread was confirmed by injecting 0.5 ml. of Isovue-250.  A spot radiograph was obtained of this image.    Next, a 0.5 ml. volume of the injectate described above was injected. The needle was then redirected to the other facet joint nerves mentioned above if needed.  Prior to the procedure, the patient was given a Pain Diary which was completed for baseline measurements.  After the procedure, the patient rated their pain every 30 minutes and will continue rating at this frequency for a total of 5 hours.  The patient has been asked to complete the Diary and return to [place] by mail, fax or hand delivered as soon as possible.   Additional Comments:  The patient tolerated the procedure well Dressing: 2 x 2 sterile gauze and Band-Aid    Post-procedure details: Patient was observed during the procedure. Post-procedure instructions were reviewed.  Patient left the clinic in stable condition.

## 2022-06-17 ENCOUNTER — Telehealth: Payer: Self-pay | Admitting: Physical Medicine and Rehabilitation

## 2022-06-17 NOTE — Telephone Encounter (Signed)
Patient called advised she need to schedule an appointment for follow up on her back. The number to contact patient is [phone]

## 2022-06-18 NOTE — Telephone Encounter (Signed)
Spoke with patient and she stated she pulled a muscle in her left mid back a few months ago and it is not going away. Scheduled appointment for 06/19/22

## 2022-06-19 ENCOUNTER — Ambulatory Visit (INDEPENDENT_AMBULATORY_CARE_PROVIDER_SITE_OTHER): Payer: BC Managed Care – PPO | Admitting: Physical Medicine and Rehabilitation

## 2022-06-19 DIAGNOSIS — M546 Pain in thoracic spine: Secondary | ICD-10-CM | POA: Diagnosis not present

## 2022-06-19 DIAGNOSIS — S29019A Strain of muscle and tendon of unspecified wall of thorax, initial encounter: Secondary | ICD-10-CM

## 2022-06-19 MED ORDER — PREDNISONE 50 MG PO TABS: 50.0000 mg | ORAL_TABLET | Freq: Every day | ORAL | 0 refills | Status: DC

## 2022-06-19 NOTE — Progress Notes (Signed)
Numeric Pain Rating Scale and Functional Assessment Average Pain 3   In the last MONTH (on 0-10 scale) has pain interfered with the following?  1. General activity like being  able to carry out your everyday physical activities such as walking, climbing stairs, carrying groceries, or moving a chair?  Rating( varies )   Pain in middle back, feels like a pulled muscle. Pain is starting to get better, but it does flare up

## 2022-06-19 NOTE — Progress Notes (Signed)
Jamie Mccall - 66 y.o. female MRN [id number]  Date of birth: 30-Jul-1956  Office Visit Note: Visit Date: 06/19/2022 PCP: Aletha Halim., PA-C Referred by: Aletha Halim., PA-C  Subjective: Chief Complaint  Patient presents with   Middle Back - Pain   HPI: Jamie Mccall is a 66 y.o. female who comes in today for evaluation of acute bilateral thoracic back pain, patient reports she twisted around in chair at the end of September and felt something "pop" and "pull" to bilateral thoracic back. Pain ongoing for several weeks and is exacerbated by movement and activity. She describes pain as sore and aching in nature, currently rates as 8 out of 10. Some relief of pain with home exercise regimen, rest and use of medications. No previous imaging of thoracic spine. We have previously evaluated patient for chronic lower back pain. Recent lumbar MRI imaging exhibits multilevel facet hypertrophy and spondylosis at L3-L4 with endplate depression and edema in the superior endplate of L4, suspicious for a subacute compression deformity. No high grade spinal canal stenosis noted. We did perform bilateral L4-L5 and L5-S1 intra-articular facet joint injections on 05/12/2022. Patient reports she is unsure if this procedure was beneficial in alleviating her pain as bilateral thoracic pain has overshadowed other issues. Patent states her severe bilateral thoracic discomfort is causing functional issues and making it difficult for her to complete daily tasks. Patient denies focal weakness, numbness and tingling. Patient denies recent trauma or falls.   Patient did voice concerns regarding increased pain to bilateral thoracic region post injection on 05/12/2022. This was addressed and evaluated by Dr. Ernestina Patches at that time, we did prescribe Flexeril for her to take at home. Patient states she does not feel Flexeril is helping to ease her pain.   Review of Systems  Musculoskeletal:  Positive for back pain  and myalgias.  Neurological:  Negative for tingling, sensory change, focal weakness and weakness.  All other systems reviewed and are negative.  Otherwise per HPI.  Assessment & Plan: Visit Diagnoses:    ICD-10-CM   1. Acute bilateral thoracic back pain  M54.6 Ambulatory referral to Physical Therapy    2. Thoracic myofascial strain, initial encounter  S29.019A Ambulatory referral to Physical Therapy       Plan: Findings:  Acute bilateral thoracic back pain. Patient continues to have severe pain despite good conservative therapies such as home exercise regimen, rest and use of medications. Patients clinical presentation and exam are consistent with thoracic myofascial strain. She does have tenderness with palpation of bilateral thoracic paraspinal regions. Next step is to place order for formal physical therapy with our in house team, I feel she would benefit from manual treatments and possible dry needling. I also talked about medication management, she can stop Flexeril as she feels this medication was not beneficial in alleviating her pain. I did prescribe short course of Prednisone. If her pain persists we did discuss possibility of obtaining thoracic x-rays. We will see her back in the office for follow up in approximately 6 weeks. Patient encouraged to let us know if her pain changes in nature or worsens. No red flag symptoms noted upon exam today.     Meds & Orders:  Meds ordered this encounter  Medications   predniSONE (DELTASONE) 50 MG tablet    Sig: Take 1 tablet (50 mg total) by mouth daily with breakfast. Take until completed.    Dispense:  5 tablet    Refill:  0    Orders  Placed This Encounter  Procedures   Ambulatory referral to Physical Therapy    Follow-up: Return for 6 week follow up for re-evaluation.   Procedures: No procedures performed      Clinical History: EXAM: MRI LUMBAR SPINE WITHOUT CONTRAST   TECHNIQUE: Multiplanar, multisequence MR imaging of the  lumbar spine was performed. No intravenous contrast was administered.   COMPARISON:  Radiographs 03/13/2022   FINDINGS: Segmentation: Conventional anatomy assumed, with the last open disc space designated L5-S1.   Alignment: Minimal scoliosis and minimal degenerative anterolisthesis at L3-4.   Vertebrae: Prominent endplate degenerative changes at L3-4 with superior endplate compression deformity and marrow edema at L4. There is mild endplate edema within the inferior endplate of L3 asymmetric to the right. No evidence of L3-4 discitis. No other acute or significant osseous findings. The visualized sacroiliac joints appear unremarkable.   Conus medullaris: Extends to the L1 level and appears normal.   Paraspinal and other soft tissues: No significant paraspinal findings.   Disc levels:   Sagittal images demonstrate no significant disc space findings from T10-11 through T12-L1.   L1-2: Loss of disc height with mild disc bulging and anterior osteophyte formation. No spinal stenosis or nerve root encroachment.   L2-3: Mild loss of disc height with annular disc bulging eccentric to the left. Mild inferior left foraminal narrowing with possible extraforaminal left L2 nerve root encroachment. The spinal canal and lateral recesses are patent.   L3-4: Moderate loss of disc height with annular disc bulging. As above, depression and marrow edema within the superior endplate of L4 which could reflect a subacute fracture. Mild facet and ligamentous hypertrophy. These factors contribute to mild spinal stenosis with mild narrowing of the foramina and right lateral recess.   L4-5: Preserved disc height with mild disc bulging and endplate osteophytes asymmetric to the right. Mild facet and ligamentous hypertrophy. Asymmetric right foraminal narrowing with possible extraforaminal right L4 nerve root encroachment. The spinal canal, lateral recesses and left foramen are widely patent.    L5-S1: Disc height and hydration are maintained. Mild endplate osteophyte formation asymmetric to the right and mild facet hypertrophy. No significant spinal stenosis or nerve root encroachment.   IMPRESSION: 1. Spondylosis at L3-4 with endplate depression and edema in the superior endplate of L4, suspicious for a subacute compression deformity. This may contribute to back pain although is not associated with any clear left-sided nerve root encroachment. 2. Eccentric disc bulging on the left at L2-3 causes inferior left foraminal narrowing and possible extraforaminal left L2 nerve root encroachment. 3. Asymmetric right foraminal narrowing at L4-5 with possible extraforaminal right L4 nerve root encroachment. 4. No high-grade spinal stenosis.     Electronically Signed   By: Richardean Sale M.D.   On: 03/23/2022 10:20   She reports that she quit smoking about 2 months ago. Her smoking use included cigarettes. She has a 15.00 pack-year smoking history. She has never been exposed to tobacco smoke. She has never used smokeless tobacco. No results for input(s): "HGBA1C", "LABURIC" in the last 8760 hours.  Objective:  VS:  HT:    WT:   BMI:     BP:   HR: bpm  TEMP: ( )  RESP:  Physical Exam Vitals and nursing note reviewed.  HENT:     Head: Normocephalic and atraumatic.     Right Ear: External ear normal.     Left Ear: External ear normal.     Nose: Nose normal.     Mouth/Throat:  Mouth: Mucous membranes are moist.  Eyes:     Extraocular Movements: Extraocular movements intact.  Cardiovascular:     Rate and Rhythm: Normal rate.     Pulses: Normal pulses.  Pulmonary:     Effort: Pulmonary effort is normal.  Abdominal:     General: Abdomen is flat. There is no distension.  Musculoskeletal:        General: Tenderness present.     Cervical back: Normal range of motion.     Comments: Pt rises from seated position to standing without difficulty. Concordant low back pain  with facet loading, lumbar spine extension and rotation. Strong distal strength without clonus, no pain upon palpation of greater trochanters. Sensation intact bilaterally. Tenderness noted to bilateral thoracic paraspinal region with palpation. Walks independently, gait steady.   Skin:    General: Skin is warm and dry.     Capillary Refill: Capillary refill takes less than 2 seconds.  Neurological:     General: No focal deficit present.     Mental Status: She is alert and oriented to person, place, and time.  Psychiatric:        Mood and Affect: Mood normal.        Behavior: Behavior normal.     Ortho Exam  Imaging: No results found.  Past Medical/Family/Surgical/Social History: Medications & Allergies reviewed per EMR, new medications updated. 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   Ptosis 07/31/2013   PCO (posterior capsular opacification) 03/13/2013   History of corneal transplant 08/12/2012   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[place]) 05/04/2012   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 Fredna Dow    MASS EXCISION Left 11/05/2016   Procedure: Excision of left foot mass;  Surgeon: Wylene Simmer, MD;  Location: [place];  Service: Orthopedics;  Laterality: Left;   SQUAMOUS CELL CARCINOMA EXCISION  08/18/2018   right eye   Social History   Occupational History   Not on file  Tobacco Use   Smoking status: Former    Packs/day: 1.00    Years: 15.00    Total pack years: 15.00    Types: Cigarettes    Quit date: 04/13/2022    Years since quitting: 0.1    Passive exposure: Never   Smokeless tobacco: Never  Vaping Use  Vaping Use: Never used  Substance and Sexual Activity   Alcohol use: Yes    Comment: wine daily   Drug use: Never   Sexual activity: Not on file

## 2022-06-22 ENCOUNTER — Encounter: Payer: Self-pay | Admitting: Physical Medicine and Rehabilitation

## 2022-06-26 NOTE — Therapy (Addendum)
OUTPATIENT PHYSICAL THERAPY THORACOLUMBAR EVALUATION DISCHARGE SUMMARY   Patient Name: Jamie Mccall MRN: [id number] DOB:Jul 16, 1956, 66 y.o., female Today's Date: 06/29/2022  END OF SESSION:  PT End of Session - 06/29/22 1327     Visit Number 1    Number of Visits 6    Date for PT Re-Evaluation 08/17/21    Authorization Type BLUE CROSS BLUE [employer]IELD    PT Start Time 1100    PT Stop Time 1140    PT Time Calculation (min) 40 min    Activity Tolerance Patient tolerated treatment well    Behavior During Therapy WFL for tasks assessed/performed             Past Medical History:  Diagnosis Date   Arthritis    RA   Past Surgical History:  Procedure Laterality Date   EXCISION MASS UPPER EXTREMETIES Left 06/15/2017   Procedure: EXCISION MASS LEFT INDEX FINGER;  Surgeon: Daryll Brod, MD;  Location: [place];  Service: Orthopedics;  Laterality: Left;   EYE SURGERY     rt corneal transplants   FOOT SURGERY Left 2018   cyst removed    HAND SURGERY Right    HAND SURGERY Left 2019   Dr. Fredna Dow    MASS EXCISION Left 11/05/2016   Procedure: Excision of left foot mass;  Surgeon: Wylene Simmer, MD;  Location: [place];  Service: Orthopedics;  Laterality: Left;   SQUAMOUS CELL CARCINOMA EXCISION  08/18/2018   right eye   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   Ptosis 07/31/2013   PCO (posterior capsular opacification) 03/13/2013   History of corneal  transplant 08/12/2012   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[place]) 05/04/2012   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    PCP: Aletha Halim., PA-C  REFERRING PROVIDER: Lorine Bears, NP  REFERRING DIAG: Acute bilateral thoracic back pain [M54.6], Thoracic myofascial strain, initial encounter [T73.220U]   Rationale for Evaluation and Treatment: Rehabilitation  THERAPY DIAG:  Abnormal posture - Plan: PT plan of care cert/re-cert  Other low back pain - Plan: PT plan of care cert/re-cert  Pain in thoracic spine - Plan: PT plan of care cert/re-cert  ONSET DATE: Sep 2023  SUBJECTIVE:  SUBJECTIVE STATEMENT: Jamie Mccall arrives at PT with reports of acute thoracic pain and chronic lower back pain. She states that in September she twisted while in the kitchen resulting in a pop and a painful pulling sensation. Pt states that her pain has subsided since the initial injury, however she continues to bother her with increased activity.   PERTINENT HISTORY:  None  PAIN:  Are you having pain? Yes: NPRS scale: 1-2/10 Pain location: R side thoracic pain Pain description: nagging pain, not really pain Aggravating factors: Laying down flat, first thing in the morning.  Relieving factors: heating pad,  advil, topical creams   PRECAUTIONS: None  WEIGHT BEARING RESTRICTIONS: No  FALLS:  Has patient fallen in last 6 months? No  LIVING ENVIRONMENT: Lives with: lives with their spouse Lives in: House/apartment Stairs: Yes: Internal: 12 steps; on right going up and External: 4-5 steps; bilateral but cannot reach both Has following equipment at home: None  OCCUPATION: Retired   PLOF: Independent  PATIENT GOALS: Get rid of pain, so I can do everything I want.    NEXT MD VISIT:   OBJECTIVE:   DIAGNOSTIC FINDINGS:  Recent lumbar MRI imaging exhibits multilevel facet hypertrophy and spondylosis at L3-L4 with endplate depression and edema in the superior endplate of L4, suspicious for a subacute compression deformity. No high grade spinal canal stenosis noted.   PATIENT SURVEYS:  FOTO 51.55%, 68% in 10 visits   COGNITION: Overall cognitive status: Within functional limits for tasks assessed     SENSATION: WFL  POSTURE: rounded shoulders and forward head  PALPATION: Tender to palpation at R thoracic, just below scapular.   LUMBAR ROM:   AROM eval  Flexion WFL  Extension WFL  Right lateral flexion WFL  Left lateral flexion WFL  Right rotation WFL  Left rotation WFL   (Blank rows = not tested)  MMT:  Shoulder MMT 5/5  GAIT: Distance walked: 2f  Assistive device utilized: None Level of assistance: Complete Independence Comments: Normal gait pattern   TODAY'S TREATMENT:                                                                                                                              DATE: Creating, reviewing, and completing below HEP  STM to thoracic region.    PATIENT EDUCATION:  Education details: Educated pt on anatomy and physiology of current symptoms, FOTO, diagnosis, prognosis, HEP,  and POC. Person educated: Patient Education method: ECustomer service managerEducation comprehension: verbalized understanding and returned demonstration  HOME EXERCISE PROGRAM: Access Code: [email or web address]: [email or web address] Date: 06/29/2022 Prepared by: SRudi Heap Exercises - Seated Scapular Retraction  - 2 x daily - 7 x weekly - 2 sets - 10 reps - Sidelying Thoracic Rotation with Open Book  - 2 x daily - 7 x weekly - 2 sets - 10 reps - Standing Shoulder Row with Anchored Resistance  - 2 x daily - 7 x weekly - 2  sets - 10 reps  ASSESSMENT:  CLINICAL IMPRESSION: Patient referred to PT for  thoracic pain and lower back pain. She demonstrates good overall strength, but gets fatigued very quickly. Pt had increased tension in her R thoracic region. Patient will benefit from skilled PT to address below impairments, limitations and improve overall function.  OBJECTIVE IMPAIRMENTS: decreased activity tolerance, difficulty walking, decreased balance, decreased endurance, decreased mobility, decreased ROM, decreased strength, impaired flexibility, impaired UE/LE use, postural dysfunction, and pain.  ACTIVITY LIMITATIONS: bending, lifting, carry, locomotion, cleaning, community activity, driving, and or occupation  PERSONAL FACTORS: Age, environmental factors are also affecting patient's functional outcome.  REHAB POTENTIAL: Excellent  CLINICAL DECISION MAKING: Stable/uncomplicated  EVALUATION COMPLEXITY: Low    GOALS: Short term PT Goals Target date: 07/13/2022 Pt will be I and compliant with HEP. Baseline:  Goal status: New  Long term PT goals Target date: 08/10/2022  Pt will improve pain <2 with functional activities Baseline: Goal status: New Pt will improve FOTO to at least 68% functional to show improved function Baseline: Goal status: New Pt will be able to clean her house without onset of familiar pain.  Baseline:  Goal status: New  PLAN: PT FREQUENCY: 1-3 times per week   PT DURATION: 6-8 weeks  PLANNED INTERVENTIONS (unless contraindicated): aquatic PT, Canalith repositioning, cryotherapy, Electrical stimulation, Iontophoresis with 4 mg/ml dexamethasome, Moist heat, traction, Ultrasound, gait training, Therapeutic exercise, balance training, neuromuscular re-education, patient/family education, prosthetic training, manual techniques, passive ROM, dry needling, taping, vasopnuematic device, vestibular, spinal manipulations, joint manipulations  PLAN FOR NEXT SESSION: Assess HEP/update PRN, continue to progress functional mobility in thoracic region, strengthen  paraspinals, and RTC muscles. Decrease patients pain and help minimize functional deficits.     Lynden Ang, PT 06/29/2022, 1:29 PM     PHYSICAL THERAPY DISCHARGE SUMMARY  Visits from Start of Care: 1  Current functional level related to goals / functional outcomes: See above   Remaining deficits: unknown   Education / Equipment: HEP   Patient agrees to discharge. Patient goals were not met. Patient is being discharged due to not returning since the last visit.  Laureen Abrahams, PT, DPT 08/20/22 12:19 PM  [facility] [address] [place], [place], [address] Phone: [phone]   Fax:  [phone]

## 2022-06-29 ENCOUNTER — Other Ambulatory Visit: Payer: Self-pay

## 2022-06-29 ENCOUNTER — Ambulatory Visit (INDEPENDENT_AMBULATORY_CARE_PROVIDER_SITE_OTHER): Payer: BC Managed Care – PPO | Admitting: Physical Therapy

## 2022-06-29 DIAGNOSIS — R293 Abnormal posture: Secondary | ICD-10-CM | POA: Diagnosis not present

## 2022-06-29 DIAGNOSIS — M546 Pain in thoracic spine: Secondary | ICD-10-CM | POA: Diagnosis not present

## 2022-06-29 DIAGNOSIS — M5459 Other low back pain: Secondary | ICD-10-CM | POA: Diagnosis not present

## 2022-07-07 ENCOUNTER — Encounter: Payer: BC Managed Care – PPO | Admitting: Physical Therapy

## 2022-07-14 ENCOUNTER — Encounter: Payer: BC Managed Care – PPO | Admitting: Physical Therapy

## 2022-07-20 ENCOUNTER — Telehealth: Payer: Self-pay | Admitting: Physical Therapy

## 2022-07-20 NOTE — Telephone Encounter (Signed)
I called pt to follow up of why she wanted to cancel her follow up PT appointments but pt was not available. All PT appointments were canceled per pt request on a message left earlier at our clinic.   Kearney Hard, PT, MPT 07/20/22 12:48 PM

## 2022-07-21 ENCOUNTER — Encounter: Payer: BC Managed Care – PPO | Admitting: Physical Therapy

## 2022-07-28 ENCOUNTER — Encounter: Payer: BC Managed Care – PPO | Admitting: Physical Therapy

## 2022-08-07 ENCOUNTER — Other Ambulatory Visit: Payer: Self-pay | Admitting: Physician Assistant

## 2022-08-07 NOTE — Telephone Encounter (Signed)
Next Visit: 08/28/2022   Last Visit: 04/23/2022   Labs: 02/20/2022 CBC and CMP WNL.   Eye exam: 03/09/2022 (Faxed PLQ eye exam form to eye doctor to complete.)    Current Dose per office note 04/23/2022: Plaquenil 200 mg 1 tablet by mouth twice daily Monday through Friday   JE:ADGNPHQNET arthritis involving multiple sites with positive rheumatoid factor    Last Fill: 04/27/2022   Okay to refill Plaquenil?

## 2022-08-14 NOTE — Progress Notes (Signed)
Office Visit Note  Patient: Jamie Mccall             Date of Birth: [birth year]           MRN: [id number]             PCP: Aletha Halim., PA-C Referring: Aletha Halim., PA-C Visit Date: 08/28/2022 Occupation: '@GUAROCC'$ @  Subjective:  Pain in left hand  History of Present Illness: Jamie Mccall is a 67 y.o. female with history of seropositive rheumatoid arthritis, osteoarthritis and degenerative disc disease.  She states she was having increased lower back pain.  She had MRI of the lumbar spine and was evaluated by [facility].  She had injection in the lumbar spine which helped.  She states recently she sprained her upper back which has been causing discomfort.  She has not noticed any joint swelling from rheumatoid arthritis.  She has been taking hydroxychloroquine on a regular basis.  She continues to have severe pain and discomfort in her left hand from carpal tunnel syndrome.  She continues to have tingling and numbness in her left hand.  Using carpal tunnel syndrome brace.  Of the other joints are painful or swollen.    Activities of Daily Living:  Patient reports morning stiffness for 0 minutes.   Patient Reports nocturnal pain.  Difficulty dressing/grooming: Denies Difficulty climbing stairs: Denies Difficulty getting out of chair: Denies Difficulty using hands for taps, buttons, cutlery, and/or writing: Reports  Review of Systems  Constitutional:  Negative for fatigue.  HENT:  Negative for mouth sores and mouth dryness.   Eyes:  Negative for dryness.  Respiratory:  Negative for shortness of breath.   Cardiovascular:  Negative for chest pain and palpitations.  Gastrointestinal:  Negative for blood in stool, constipation and diarrhea.  Endocrine: Negative for increased urination.  Genitourinary:  Negative for involuntary urination.  Musculoskeletal:  Positive for joint pain and joint pain. Negative for gait problem, joint swelling, myalgias, muscle  weakness, morning stiffness, muscle tenderness and myalgias.  Skin:  Negative for color change, rash, hair loss and sensitivity to sunlight.  Allergic/Immunologic: Negative for susceptible to infections.  Neurological:  Positive for numbness and parasthesias. Negative for dizziness and headaches.  Hematological:  Negative for swollen glands.  Psychiatric/Behavioral:  Positive for sleep disturbance. Negative for depressed mood. The patient is not nervous/anxious.     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   Ptosis 07/31/2013   PCO (posterior capsular opacification) 03/13/2013   History of corneal transplant 08/12/2012   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[place]) 05/04/2012   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 Fredna Dow    MASS EXCISION Left 11/05/2016   Procedure:  Excision of left foot mass;  Surgeon: Wylene Simmer, MD;  Location: [place];  Service: Orthopedics;  Laterality: Left;   SQUAMOUS CELL CARCINOMA EXCISION  08/18/2018   right eye   Social History   Social History Narrative   Not on file   Immunization History  Administered Date(s) Administered   Influenza,inj,Quad PF,6+ Mos 08/18/2017, 08/18/2017     Objective: Vital Signs: BP (!) 170/90 (BP Location: Left Arm, Patient Position: Sitting, Cuff Size: Large)   Pulse 80   Resp 16   Ht 5' 4.5" (1.638 m)   Wt 217 lb 12.8 oz (98.8 kg)   BMI 36.81 kg/m    Physical Exam Vitals and nursing note reviewed.  Constitutional:      Appearance: She is well-developed.  HENT:     Head: Normocephalic and atraumatic.  Eyes:     Conjunctiva/sclera: Conjunctivae normal.  Cardiovascular:     Rate and Rhythm: Normal rate and regular rhythm.     Heart sounds: Normal heart sounds.  Pulmonary:     Effort: Pulmonary effort is normal.     Breath sounds: Normal breath sounds.  Abdominal:     General: Bowel sounds are normal.     Palpations: Abdomen is soft.  Musculoskeletal:     Cervical back: Normal range of motion.  Lymphadenopathy:     Cervical: No cervical adenopathy.  Skin:    General: Skin is warm and dry.     Capillary Refill: Capillary refill takes less than 2 seconds.  Neurological:     Mental Status: She is alert and oriented to person, place, and time.  Psychiatric:        Behavior: Behavior normal.     Musculoskeletal Exam: Cervical spine was in good range of motion.  She had some discomfort in the thoracic paravertebral region.  She had discomfort in the lower lumbar region.  Shoulder joints, elbow joints, wrist joints with good range of motion.  She has synovial thickening and some synovitis over bilateral second MCP joints.  PIP and DIP thickening was noted.  Nodulosis was noted.  Hip joints and knee joints with good range of motion.  She had no tenderness over  ankles or MTPs.  CDAI Exam: CDAI Score: 4.8  Patient Global: 4 mm; Provider Global: 4 mm Swollen: 2 ; Tender: 2  Joint Exam 08/28/2022      Right  Left  MCP 2  Swollen Tender  Swollen Tender     Investigation: No additional findings.  Imaging: No results found.  Recent Labs: Lab Results  Component Value Date   WBC 6.1 02/20/2022   HGB 14.7 02/20/2022   PLT 263 02/20/2022   NA 137 02/20/2022   K 4.6 02/20/2022   CL 101 02/20/2022   CO2 28 02/20/2022   GLUCOSE 88 02/20/2022   BUN 13 02/20/2022   CREATININE 0.69 02/20/2022   BILITOT 0.6 02/20/2022   ALKPHOS 61 10/13/2016   AST 16 02/20/2022   ALT 22 02/20/2022   PROT 6.8 02/20/2022   ALBUMIN 3.7 10/13/2016   CALCIUM 9.5 02/20/2022  GFRAA 108 12/24/2020   QFTBGOLDPLUS NEGATIVE 12/22/2017    Speciality Comments: I called patient regarding PLQ eye exam. shc 02/12/2021  pt states she will not use Enbrel again/ causes Bronchitis,  Procedures:  Hand/UE Inj: L carpal tunnel for carpal tunnel syndrome on 08/28/2022 12:07 PM Indications: pain Details: 27 G needle, ultrasound-guided ulnar approach Medications: 0.5 mL lidocaine 1 %; 10 mg triamcinolone acetonide 40 MG/ML Aspirate: 0 mL Outcome: tolerated well, no immediate complications Procedure, treatment alternatives, risks and benefits explained, specific risks discussed. Consent was given by the patient. Immediately prior to procedure a time out was called to verify the correct patient, procedure, equipment, support staff and site/side marked as required. Patient was prepped and draped in the usual sterile fashion.    Allergies: Enbrel [etanercept] and Sulfasalazine   Assessment / Plan:     Visit Diagnoses: Rheumatoid arthritis involving multiple sites with positive rheumatoid factor (HCC) - positive RF, positive anti-CCP, +'14 3 3 '$ eta.  Patient has synovitis of the bilateral second MCP joints.  PIP and DIP thickening was noted.  She states her rheumatoid arthritis is  well-controlled on hydroxychloroquine.  She does not want more aggressive therapy.  She understands that she still has ongoing inflammatory arthritis which can cause progression of the disease.  High risk medication use - Plaquenil 200 mg 1 tablet by mouth twice daily Monday through Friday. Patient has declined the use of other DMARDs or biologic agents. -Will obtain labs today.  Labs from July were reviewed which were within normal limits.  Plan: CBC with Differential/Platelet, COMPLETE METABOLIC PANEL WITH GFR  Carpal tunnel syndrome of left wrist-she has been having severe pain and discomfort in the left carpal tunnel.  She requests cortisone injection today.  After indications side effects contraindications were discussed and informed consent was obtained left carpal tunnel was injected with lidocaine and cortisone as described above.  She tolerated the procedure well.  Postprocedure instructions given.  She was advised to use carpal tunnel brace.  Patient was advised to contact us if her symptoms persist.  Primary osteoarthritis of both hands-she has osteoarthritis involving bilateral hands.  She continues to have discomfort.  Pain in thoracic spine-she continues to have some paravertebral thoracic discomfort.  She will follow-up with Dr. Ernestina Patches.  DDD (degenerative disc disease), lumbar - She had MRI of the lumbar spine on March 20, 2022.  Patient states she had injections by Dr. Ernestina Patches and had some pain relief.  History of corneal transplant - Followed by ophthalmologist at [facility].  History of hypertension-blood pressure was elevated at 170/90.  She states she has been taking a lot of ibuprofen.  She was advised to avoid ibuprofen.  Repeat blood pressure was elevated.  She was advised to monitor blood pressure closely and follow-up with the PCP.  Vitamin D deficiency-vitamin D was 45 on April 25, 2021.  History of depression  Other fatigue  Postmenopausal - She has had  previous DEXA scan which was normal per patient.  Orders: Orders Placed This Encounter  Procedures   CBC with Differential/Platelet   COMPLETE METABOLIC PANEL WITH GFR   No orders of the defined types were placed in this encounter.    Follow-Up Instructions: Return for Rheumatoid arthritis.   Bo Merino, MD  Note - This record has been created using Editor, commissioning.  Chart creation errors have been sought, but may not always  have been located. Such creation errors do not reflect on  the standard of medical care.

## 2022-08-21 ENCOUNTER — Telehealth: Payer: Self-pay | Admitting: Physical Medicine and Rehabilitation

## 2022-08-21 ENCOUNTER — Other Ambulatory Visit: Payer: Self-pay | Admitting: Physical Medicine and Rehabilitation

## 2022-08-21 DIAGNOSIS — M546 Pain in thoracic spine: Secondary | ICD-10-CM

## 2022-08-21 NOTE — Telephone Encounter (Signed)
Pt called requesting an appt. Pt states she is in a lot of pain. Please call pt at [phone].

## 2022-08-21 NOTE — Telephone Encounter (Signed)
Spoke with patient and she wold like to have the MRI first

## 2022-08-28 ENCOUNTER — Ambulatory Visit: Payer: BC Managed Care – PPO | Attending: Physician Assistant | Admitting: Rheumatology

## 2022-08-28 ENCOUNTER — Ambulatory Visit: Payer: BC Managed Care – PPO

## 2022-08-28 ENCOUNTER — Encounter: Payer: Self-pay | Admitting: Rheumatology

## 2022-08-28 VITALS — BP 154/83 | HR 85 | Resp 16 | Ht 64.5 in | Wt 217.8 lb

## 2022-08-28 DIAGNOSIS — M19041 Primary osteoarthritis, right hand: Secondary | ICD-10-CM | POA: Diagnosis not present

## 2022-08-28 DIAGNOSIS — E559 Vitamin D deficiency, unspecified: Secondary | ICD-10-CM

## 2022-08-28 DIAGNOSIS — G5602 Carpal tunnel syndrome, left upper limb: Secondary | ICD-10-CM

## 2022-08-28 DIAGNOSIS — M0579 Rheumatoid arthritis with rheumatoid factor of multiple sites without organ or systems involvement: Secondary | ICD-10-CM

## 2022-08-28 DIAGNOSIS — Z79899 Other long term (current) drug therapy: Secondary | ICD-10-CM | POA: Diagnosis not present

## 2022-08-28 DIAGNOSIS — M546 Pain in thoracic spine: Secondary | ICD-10-CM

## 2022-08-28 DIAGNOSIS — M19042 Primary osteoarthritis, left hand: Secondary | ICD-10-CM

## 2022-08-28 DIAGNOSIS — Z8659 Personal history of other mental and behavioral disorders: Secondary | ICD-10-CM

## 2022-08-28 DIAGNOSIS — R5383 Other fatigue: Secondary | ICD-10-CM

## 2022-08-28 DIAGNOSIS — Z780 Asymptomatic menopausal state: Secondary | ICD-10-CM

## 2022-08-28 DIAGNOSIS — M5136 Other intervertebral disc degeneration, lumbar region: Secondary | ICD-10-CM

## 2022-08-28 DIAGNOSIS — Z8679 Personal history of other diseases of the circulatory system: Secondary | ICD-10-CM

## 2022-08-28 DIAGNOSIS — Z947 Corneal transplant status: Secondary | ICD-10-CM

## 2022-08-28 MED ORDER — LIDOCAINE HCL 1 % IJ SOLN
0.5000 mL | INTRAMUSCULAR | Status: AC | PRN
  Administered 2022-08-28: .5 mL

## 2022-08-28 MED ORDER — TRIAMCINOLONE ACETONIDE 40 MG/ML IJ SUSP
10.0000 mg | INTRAMUSCULAR | Status: AC | PRN
  Administered 2022-08-28: 10 mg

## 2022-08-28 NOTE — Patient Instructions (Signed)
Vaccines You are taking a medication(s) that can suppress your immune system.  The following immunizations are recommended: Flu annually Covid-19  Td/Tdap (tetanus, diphtheria, pertussis) every 10 years Pneumonia (Prevnar 15 then Pneumovax 23 at least 1 year apart.  Alternatively, can take Prevnar 20 without needing additional dose) Shingrix: 2 doses from 4 weeks to 6 months apart  Please check with your PCP to make sure you are up to date.

## 2022-08-29 LAB — COMPLETE METABOLIC PANEL WITH GFR
AG Ratio: 1.5 (calc) (ref 1.0–2.5)
ALT: 15 U/L (ref 6–29)
AST: 17 U/L (ref 10–35)
Albumin: 4.1 g/dL (ref 3.6–5.1)
Alkaline phosphatase (APISO): 68 U/L (ref 37–153)
BUN: 10 mg/dL (ref 7–25)
CO2: 28 mmol/L (ref 20–32)
Calcium: 9.2 mg/dL (ref 8.6–10.4)
Chloride: 97 mmol/L — ABNORMAL LOW (ref 98–110)
Creat: 0.63 mg/dL (ref 0.50–1.05)
Globulin: 2.7 g/dL (calc) (ref 1.9–3.7)
Glucose, Bld: 87 mg/dL (ref 65–99)
Potassium: 4.8 mmol/L (ref 3.5–5.3)
Sodium: 134 mmol/L — ABNORMAL LOW (ref 135–146)
Total Bilirubin: 0.6 mg/dL (ref 0.2–1.2)
Total Protein: 6.8 g/dL (ref 6.1–8.1)
eGFR: 99 mL/min/{1.73_m2} (ref >=60)

## 2022-08-29 LAB — CBC WITH DIFFERENTIAL/PLATELET
Absolute Monocytes: 539 cells/uL (ref 200–950)
Basophils Absolute: 81 cells/uL (ref 0–200)
Basophils Relative: 1.4 %
Eosinophils Absolute: 191 cells/uL (ref 15–500)
Eosinophils Relative: 3.3 %
HCT: 42.7 % (ref 35.0–45.0)
Hemoglobin: 14.7 g/dL (ref 11.7–15.5)
Lymphs Abs: 1143 cells/uL (ref 850–3900)
MCH: 33.4 pg — ABNORMAL HIGH (ref 27.0–33.0)
MCHC: 34.4 g/dL (ref 32.0–36.0)
MCV: 97 fL (ref 80.0–100.0)
MPV: 9.6 fL (ref 7.5–12.5)
Monocytes Relative: 9.3 %
Neutro Abs: 3845 cells/uL (ref 1500–7800)
Neutrophils Relative %: 66.3 %
Platelets: 266 10*3/uL (ref 140–400)
RBC: 4.4 10*6/uL (ref 3.80–5.10)
RDW: 11.8 % (ref 11.0–15.0)
Total Lymphocyte: 19.7 %
WBC: 5.8 10*3/uL (ref 3.8–10.8)

## 2022-08-29 NOTE — Progress Notes (Signed)
CBC and CMP are normal.

## 2022-09-01 ENCOUNTER — Ambulatory Visit
Admission: RE | Admit: 2022-09-01 | Discharge: 2022-09-01 | Disposition: A | Discharge status: Home / Self Care | Payer: BC Managed Care – PPO | Source: Ambulatory Visit | Attending: Physical Medicine and Rehabilitation | Admitting: Physical Medicine and Rehabilitation

## 2022-09-01 DIAGNOSIS — M546 Pain in thoracic spine: Secondary | ICD-10-CM

## 2022-09-02 ENCOUNTER — Telehealth: Payer: Self-pay | Admitting: Physical Medicine and Rehabilitation

## 2022-09-02 NOTE — Telephone Encounter (Signed)
Spoke with patient and she stated she got her MRI on 09/01/22. Scheduled OV for review on 09/07/22

## 2022-09-02 NOTE — Telephone Encounter (Signed)
Patient requesting an appt with Dr. Ernestina Patches

## 2022-09-03 ENCOUNTER — Telehealth: Payer: Self-pay | Admitting: Physical Medicine and Rehabilitation

## 2022-09-03 NOTE — Telephone Encounter (Signed)
Patient called needing to reschedule her appointment. The number to contact patient is [phone]

## 2022-09-03 NOTE — Telephone Encounter (Signed)
Spoke with patient and she wants to move her appointment to tomorrow so she can know the results of her MRI before the weekend. Rescheduled to 09/04/22

## 2022-09-04 ENCOUNTER — Ambulatory Visit (INDEPENDENT_AMBULATORY_CARE_PROVIDER_SITE_OTHER): Payer: BC Managed Care – PPO | Admitting: Physical Medicine and Rehabilitation

## 2022-09-04 ENCOUNTER — Encounter: Payer: Self-pay | Admitting: Physical Medicine and Rehabilitation

## 2022-09-04 DIAGNOSIS — G8929 Other chronic pain: Secondary | ICD-10-CM | POA: Diagnosis not present

## 2022-09-04 DIAGNOSIS — M8000XA Age-related osteoporosis with current pathological fracture, unspecified site, initial encounter for fracture: Secondary | ICD-10-CM | POA: Diagnosis not present

## 2022-09-04 DIAGNOSIS — S22070A Wedge compression fracture of T9-T10 vertebra, initial encounter for closed fracture: Secondary | ICD-10-CM | POA: Diagnosis not present

## 2022-09-04 DIAGNOSIS — M546 Pain in thoracic spine: Secondary | ICD-10-CM

## 2022-09-04 MED ORDER — TRAMADOL HCL 50 MG PO TABS: 50.0000 mg | ORAL_TABLET | Freq: Three times a day (TID) | ORAL | 0 refills | Status: DC | PRN

## 2022-09-04 NOTE — Progress Notes (Unsigned)
Jamie Mccall - 67 y.o. female MRN [id number]  Date of birth: April 07, 1956  Office Visit Note: Visit Date: 09/04/2022 PCP: Aletha Halim., PA-C Referred by: Aletha Halim., PA-C  Subjective: Chief Complaint  Patient presents with   Lower Back - Pain   HPI: Jamie Mccall is a 67 y.o. female who comes in today for evaluation of chronic, worsening and severe bilateral thoracic back pain. Pain started in September after she twisted around in chair and felt a popping and pulling sensation. States her pain has increased significantly over the last several weeks. She describes her pain as a sharp sensation, currently rates as 8 out of 10. Some relief of pain with home exercise regimen, rest and use of medications. Recent thoracic MRI imaging exhibits acute-subacute T9 vertebral body compression fracture with marrow edema and 30% height loss, there is also acute-subacute T10 vertebral body compression fracture with marrow edema and 40% height loss. States her pain is progressively becoming worse and negatively impacting her daily life. She is currently having custom home built and is planning on moving soon. We have treated patient for chronic lower back pain in the past, did undergo bilateral L4-L5 and L5-S1 intra-articular facet joint injections on 05/12/2022, she is unsure if this procedure was beneficial for her. Patient does have history of rheumatoid arthritis, currently being treated by Dr. Bo Merino at [facility]. No known history of osteoporosis. Patient denies focal weakness, numbness and tingling. No recent falls.    Review of Systems  Musculoskeletal:  Positive for back pain.  Neurological:  Negative for tingling, sensory change, focal weakness and weakness.  All other systems reviewed and are negative.  Otherwise per HPI.  Assessment & Plan: Visit Diagnoses:    ICD-10-CM   1. Chronic bilateral thoracic back pain  M54.6 Ambulatory referral to  Neurosurgery   G89.29     2. Compression fracture of T9 vertebra, initial encounter [facility])  S22.070A Ambulatory referral to Neurosurgery    3. Compression fracture of T10 vertebra, initial encounter [facility])  S22.070A Ambulatory referral to Neurosurgery    4. Age-related osteoporosis with current pathological fracture, initial encounter  M80.00XA Ambulatory referral to Neurosurgery       Plan: Findings:  Chronic, worsening and severe bilateral thoracic pain. Most severe pain is lower thoracic spine.  Patient continues to have severe pain despite good conservative therapies such as home exercise regimen, rest and use of medications. I did discuss recent thoracic MRI with patient today using imaging.  Patient's clinical presentation and exam are consistent with T9/T10 nerve pattern. There is acute-subacute compression fractures at the levels of T9 and T10 on recent MRI imaging. Given that patient had low impact injury in September and sustained 2 compression fractures, diagnosis of osteoporosis is assumed. We did discuss treatment options in detail today which include possible epidural steroid injection and medication management. Dr. Ileene Rubens also joined our visit to discuss kyphoplasty, which he did recommend as good option for her. He also explained that typically compression fractures do heal over time, usually within several months. Patient states she would like to be referred to Dr. Kristeen Miss at [facility] to further discuss kyphoplasty. I did place order for diagnostic and hopefully therapeutic T10-T11 interlaminar epidural steroid injection under fluoroscopic guidance. I also prescribed short course of Tramadol for moderate/severe pain. We will make sure out note gets back to Dr. Estanislado Pandy so that she can began appropriate treatment of osteoporosis. Patent instructed to remain active  as tolerated. We will get her in quickly for injection and will look for Dr. Clarice Pole office visit  note. No red flag symptoms noted upon exam today.     Meds & Orders:  Meds ordered this encounter  Medications   traMADol (ULTRAM) 50 MG tablet    Sig: Take 1 tablet (50 mg total) by mouth every 8 (eight) hours as needed.    Dispense:  30 tablet    Refill:  0    Orders Placed This Encounter  Procedures   Ambulatory referral to Neurosurgery    Follow-up: Return for T10-T11 interlaminar epidural steroid injection.   Procedures: No procedures performed      Clinical History: EXAM: MRI THORACIC SPINE WITHOUT CONTRAST   TECHNIQUE: Multiplanar, multisequence MR imaging of the thoracic spine was performed. No intravenous contrast was administered.   COMPARISON:  None Available.   FINDINGS: Alignment:  Physiologic.   Vertebrae: Chronic T6 vertebral body compression fracture with 40% anterior height loss. Acute-subacute T9 vertebral body compression fracture with marrow edema throughout the vertebral body and approximately 30% central height loss. Acute-subacute T10 vertebral body compression fracture with approximately 40% anterior height loss and marrow edema throughout the vertebral body. No discitis or osteomyelitis. No aggressive osseous lesion.   Cord:  Normal signal and morphology.   Paraspinal and other soft tissues: No acute paraspinal abnormality.   Disc levels:   Disc spaces:  Degenerative disease with disc height loss at C6-7.   C6-7: Broad-based disc bulge. No foraminal or central canal stenosis.   T1-T2: No disc protrusion, foraminal stenosis or central canal stenosis.   T2-T3: No disc protrusion, foraminal stenosis or central canal stenosis.   T3-T4: No disc protrusion, foraminal stenosis or central canal stenosis.   T4-T5: No disc protrusion, foraminal stenosis or central canal stenosis.   T5-T6: No disc protrusion, foraminal stenosis or central canal stenosis.   T6-T7: No disc protrusion, foraminal stenosis or central canal stenosis.    T7-T8: No disc protrusion, foraminal stenosis or central canal stenosis.   T8-T9: No disc protrusion, foraminal stenosis or central canal stenosis.   T9-T10: No disc protrusion, foraminal stenosis or central canal stenosis.   T10-T11: No disc protrusion, foraminal stenosis or central canal stenosis.   T11-T12: No disc protrusion, foraminal stenosis or central canal stenosis.   IMPRESSION: 1. Acute-subacute T9 vertebral body compression fracture with marrow edema throughout the vertebral body and approximately 30% central height loss. 2. Acute-subacute T10 vertebral body compression fracture with approximately 40% anterior height loss and marrow edema throughout the vertebral body.     Electronically Signed   By: Kathreen Devoid M.D.   On: 09/02/2022 12:07   She reports that she has been smoking cigarettes. She has a 15.00 pack-year smoking history. She has never been exposed to tobacco smoke. She has never used smokeless tobacco. No results for input(s): "HGBA1C", "LABURIC" in the last 8760 hours.  Objective:  VS:  HT:    WT:   BMI:     BP:   HR: bpm  TEMP: ( )  RESP:  Physical Exam Vitals and nursing note reviewed.  HENT:     Head: Normocephalic and atraumatic.     Right Ear: External ear normal.     Left Ear: External ear normal.     Nose: Nose normal.     Mouth/Throat:     Mouth: Mucous membranes are moist.  Eyes:     Extraocular Movements: Extraocular movements intact.  Cardiovascular:  Rate and Rhythm: Normal rate.     Pulses: Normal pulses.  Pulmonary:     Effort: Pulmonary effort is normal.  Abdominal:     General: Abdomen is flat. There is no distension.  Musculoskeletal:        General: Tenderness present.     Cervical back: Normal range of motion.     Comments: Pt rises from seated position to standing without difficulty. Good lumbar range of motion. Strong distal strength without clonus, no pain upon palpation of greater trochanters. Sensation  intact bilaterally. Walks independently, gait steady.   Skin:    General: Skin is warm and dry.     Capillary Refill: Capillary refill takes less than 2 seconds.  Neurological:     General: No focal deficit present.     Mental Status: She is alert and oriented to person, place, and time.  Psychiatric:        Mood and Affect: Mood normal.        Behavior: Behavior normal.     Ortho Exam  Imaging: No results found.  Past Medical/Family/Surgical/Social History: Medications & Allergies reviewed per EMR, new medications updated. 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   Ptosis 07/31/2013   PCO (posterior capsular opacification) 03/13/2013   History of corneal transplant 08/12/2012   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[place]) 05/04/2012   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  Fredna Dow    MASS EXCISION Left 11/05/2016   Procedure: Excision of left foot mass;  Surgeon: Wylene Simmer, MD;  Location: [place];  Service: Orthopedics;  Laterality: Left;   SQUAMOUS CELL CARCINOMA EXCISION  08/18/2018   right eye   Social History   Occupational History   Not on file  Tobacco Use   Smoking status: Every Day    Packs/day: 1.00    Years: 15.00    Total pack years: 15.00    Types: Cigarettes    Last attempt to quit: 04/13/2022    Years since quitting: 0.4    Passive exposure: Never   Smokeless tobacco: Never  Vaping Use   Vaping Use: Never used  Substance and Sexual Activity   Alcohol use: Yes    Comment: wine daily   Drug use: Never   Sexual activity: Not on file

## 2022-09-04 NOTE — Progress Notes (Unsigned)
Functional Pain Scale - descriptive words and definitions  Intense (8)    Cannot complete any ADLs without much assistance/cannot concentrate/conversation is difficult/unable to sleep and unable to use distraction. Severe range order  Average Pain  varies between 8-10  Lower back pain

## 2022-09-07 ENCOUNTER — Ambulatory Visit: Payer: BC Managed Care – PPO | Admitting: Physical Medicine and Rehabilitation

## 2022-09-10 ENCOUNTER — Telehealth: Payer: Self-pay | Admitting: *Deleted

## 2022-09-10 NOTE — Telephone Encounter (Signed)
Patient contacted the office and left message stating she would like to  know what exactly the PLQ eye exam consist of. Patient advised it is an OCT and visual field. Patient states she will contact her eye doctor and make sure to have the correct testing performed.

## 2022-09-14 ENCOUNTER — Telehealth: Payer: Self-pay | Admitting: Physical Medicine and Rehabilitation

## 2022-09-14 NOTE — Telephone Encounter (Signed)
Patient want to schedule an appointment with Dr. Ernestina Patches

## 2022-09-15 ENCOUNTER — Other Ambulatory Visit: Payer: Self-pay | Admitting: Physical Medicine and Rehabilitation

## 2022-09-15 DIAGNOSIS — G8929 Other chronic pain: Secondary | ICD-10-CM

## 2022-09-15 NOTE — Telephone Encounter (Signed)
Spoke with patient and scheduled for 09/23/22

## 2022-09-23 ENCOUNTER — Ambulatory Visit: Payer: Self-pay

## 2022-09-23 ENCOUNTER — Ambulatory Visit (INDEPENDENT_AMBULATORY_CARE_PROVIDER_SITE_OTHER): Payer: BC Managed Care – PPO | Admitting: Physical Medicine and Rehabilitation

## 2022-09-23 VITALS — BP 163/93 | HR 86

## 2022-09-23 DIAGNOSIS — M5414 Radiculopathy, thoracic region: Secondary | ICD-10-CM | POA: Diagnosis not present

## 2022-09-23 MED ORDER — METHYLPREDNISOLONE ACETATE 80 MG/ML IJ SUSP
80.0000 mg | Freq: Once | INTRAMUSCULAR | Status: AC
  Administered 2022-09-23: 80 mg

## 2022-09-23 NOTE — Progress Notes (Signed)
Functional Pain Scale - descriptive words and definitions  Distressing (6)    Pain is present/unable to complete most ADLs limited by pain/sleep is difficult and active distraction is only marginal. Moderate range order  Average Pain 6-7   +Driver, -BT, -Dye Allergies.  Middle back pain

## 2022-09-23 NOTE — Patient Instructions (Signed)

## 2022-10-05 ENCOUNTER — Telehealth: Payer: Self-pay

## 2022-10-05 DIAGNOSIS — S22070A Wedge compression fracture of T9-T10 vertebra, initial encounter for closed fracture: Secondary | ICD-10-CM

## 2022-10-05 DIAGNOSIS — G8929 Other chronic pain: Secondary | ICD-10-CM

## 2022-10-05 MED ORDER — OXYCODONE-ACETAMINOPHEN 5-325 MG PO TABS: 1.0000 | ORAL_TABLET | Freq: Three times a day (TID) | ORAL | 0 refills | Status: AC | PRN

## 2022-10-05 NOTE — Telephone Encounter (Signed)
Patient called in and stated the injection did not work. She sees Dr. Davy Pique on 10/14/22. She is requesting percocet 5-325 mg until she sees him to be sent to [facility]. Please advise

## 2022-10-05 NOTE — Addendum Note (Signed)
Addended by: Raymondo Band on: 10/05/2022 09:18 AM   Modules accepted: Orders

## 2022-10-05 NOTE — Telephone Encounter (Signed)
Spoke with patient and informed her that prescription was sent in

## 2022-10-05 NOTE — Procedures (Signed)
Thoracic epidural Steroid Injection - Interlaminar Approach with Fluoroscopic Guidance  Patient: Jamie Mccall      Date of Birth: 1956/05/16 MRN: [id number] PCP: Aletha Halim., PA-C      Visit Date: 09/23/2022   Universal Protocol:     Consent Given By: the patient  Position: PRONE  Additional Comments: Vital signs were monitored before and after the procedure. Patient was prepped and draped in the usual sterile fashion. The correct patient, procedure, and site was verified.   Injection Procedure Details:   Procedure diagnoses:  1. Thoracic radiculopathy      Meds Administered:  Meds ordered this encounter  Medications   methylPREDNISolone acetate (DEPO-MEDROL) injection 80 mg     Laterality: Right  Location/Site:  T10-11  Needle: 3.5 in., 20 ga. Tuohy  Needle Placement: Paramedian epidural  Findings:   -Comments: Excellent flow of contrast along the nerve and into the epidural space.  Procedure Details: The fluoroscope was aligned to square off the endplates at the level noted above.  The target area located is the inferior or caudal portion of the vertebral body which is essentially the superior lamina of the level indicated and the overlying structure was infiltrated with 2 to 3 mL of 1% lidocaine without epinephrine and a 22-gauge spinal needle was introduced down to the lamina until contact with bone.  This needle was utilized as a locating needle.  The fluoroscope was subsequently tilted in a caudal direction to achieve the most open interlaminar spacing noted.  Using a paramedian approach from the side mentioned above, the region overlying the interlaminar space was localized under fluoroscopic visualization and the soft tissues overlying this structure were infiltrated with 4 ml. of 1% Lidocaine without Epinephrine. The Tuohy needle was inserted into the epidural space using a paramedian approach and biplanar fluoroscopy.   The epidural space was  localized using loss of resistance along with lateral or counter oblique and bi-planar fluoroscopic views.  After negative aspirate for air, blood, and CSF, a 2 ml. volume of Isovue-250 was injected into the epidural space and the flow of contrast was observed. Radiographs were obtained for documentation purposes.    The injectate was administered into the level noted above.   Additional Comments:  The patient tolerated the procedure well Dressing: 2 x 2 sterile gauze and Band-Aid    Post-procedure details: Patient was observed during the procedure. Post-procedure instructions were reviewed.  Patient left the clinic in stable condition.

## 2022-10-05 NOTE — Progress Notes (Signed)
Jamie Mccall - 67 y.o. female MRN [id number]  Date of birth: 17-Jun-1956  Office Visit Note: Visit Date: 09/23/2022 PCP: Aletha Halim., PA-C Referred by: Aletha Halim., PA-C  Subjective: Chief Complaint  Patient presents with   Middle Back - Pain   HPI:  Jamie Mccall is a 67 y.o. female who comes in today at the request of Barnet Pall, FNP for planned Right T10-11 Thoracic Interlaminar epidural steroid injection with fluoroscopic guidance.  The patient has failed conservative care including home exercise, medications, time and activity modification.  This injection will be diagnostic and hopefully therapeutic.  Please see requesting physician notes for further details and justification.   ROS Otherwise per HPI.  Assessment & Plan: Visit Diagnoses:    ICD-10-CM   1. Thoracic radiculopathy  M54.14 XR C-ARM NO REPORT    Epidural Steroid injection    methylPREDNISolone acetate (DEPO-MEDROL) injection 80 mg      Plan: No additional findings.   Meds & Orders:  Meds ordered this encounter  Medications   methylPREDNISolone acetate (DEPO-MEDROL) injection 80 mg    Orders Placed This Encounter  Procedures   XR C-ARM NO REPORT   Epidural Steroid injection    Follow-up: Return for visit to requesting provider as needed.   Procedures: No procedures performed  Thoracic epidural Steroid Injection - Interlaminar Approach with Fluoroscopic Guidance  Patient: Jamie Mccall      Date of Birth: [birth year] MRN: [id number] PCP: Aletha Halim., PA-C      Visit Date: 09/23/2022   Universal Protocol:     Consent Given By: the patient  Position: PRONE  Additional Comments: Vital signs were monitored before and after the procedure. Patient was prepped and draped in the usual sterile fashion. The correct patient, procedure, and site was verified.   Injection Procedure Details:   Procedure diagnoses:  1. Thoracic radiculopathy      Meds  Administered:  Meds ordered this encounter  Medications   methylPREDNISolone acetate (DEPO-MEDROL) injection 80 mg     Laterality: Right  Location/Site:  T10-11  Needle: 3.5 in., 20 ga. Tuohy  Needle Placement: Paramedian epidural  Findings:   -Comments: Excellent flow of contrast along the nerve and into the epidural space.  Procedure Details: The fluoroscope was aligned to square off the endplates at the level noted above.  The target area located is the inferior or caudal portion of the vertebral body which is essentially the superior lamina of the level indicated and the overlying structure was infiltrated with 2 to 3 mL of 1% lidocaine without epinephrine and a 22-gauge spinal needle was introduced down to the lamina until contact with bone.  This needle was utilized as a locating needle.  The fluoroscope was subsequently tilted in a caudal direction to achieve the most open interlaminar spacing noted.  Using a paramedian approach from the side mentioned above, the region overlying the interlaminar space was localized under fluoroscopic visualization and the soft tissues overlying this structure were infiltrated with 4 ml. of 1% Lidocaine without Epinephrine. The Tuohy needle was inserted into the epidural space using a paramedian approach and biplanar fluoroscopy.   The epidural space was localized using loss of resistance along with lateral or counter oblique and bi-planar fluoroscopic views.  After negative aspirate for air, blood, and CSF, a 2 ml. volume of Isovue-250 was injected into the epidural space and the flow of contrast was observed. Radiographs were obtained for documentation purposes.    The injectate was administered  into the level noted above.   Additional Comments:  The patient tolerated the procedure well Dressing: 2 x 2 sterile gauze and Band-Aid    Post-procedure details: Patient was observed during the procedure. Post-procedure instructions were  reviewed.  Patient left the clinic in stable condition.    Clinical History: EXAM: MRI THORACIC SPINE WITHOUT CONTRAST   TECHNIQUE: Multiplanar, multisequence MR imaging of the thoracic spine was performed. No intravenous contrast was administered.   COMPARISON:  None Available.   FINDINGS: Alignment:  Physiologic.   Vertebrae: Chronic T6 vertebral body compression fracture with 40% anterior height loss. Acute-subacute T9 vertebral body compression fracture with marrow edema throughout the vertebral body and approximately 30% central height loss. Acute-subacute T10 vertebral body compression fracture with approximately 40% anterior height loss and marrow edema throughout the vertebral body. No discitis or osteomyelitis. No aggressive osseous lesion.   Cord:  Normal signal and morphology.   Paraspinal and other soft tissues: No acute paraspinal abnormality.   Disc levels:   Disc spaces:  Degenerative disease with disc height loss at C6-7.   C6-7: Broad-based disc bulge. No foraminal or central canal stenosis.   T1-T2: No disc protrusion, foraminal stenosis or central canal stenosis.   T2-T3: No disc protrusion, foraminal stenosis or central canal stenosis.   T3-T4: No disc protrusion, foraminal stenosis or central canal stenosis.   T4-T5: No disc protrusion, foraminal stenosis or central canal stenosis.   T5-T6: No disc protrusion, foraminal stenosis or central canal stenosis.   T6-T7: No disc protrusion, foraminal stenosis or central canal stenosis.   T7-T8: No disc protrusion, foraminal stenosis or central canal stenosis.   T8-T9: No disc protrusion, foraminal stenosis or central canal stenosis.   T9-T10: No disc protrusion, foraminal stenosis or central canal stenosis.   T10-T11: No disc protrusion, foraminal stenosis or central canal stenosis.   T11-T12: No disc protrusion, foraminal stenosis or central canal stenosis.   IMPRESSION: 1.  Acute-subacute T9 vertebral body compression fracture with marrow edema throughout the vertebral body and approximately 30% central height loss. 2. Acute-subacute T10 vertebral body compression fracture with approximately 40% anterior height loss and marrow edema throughout the vertebral body.     Electronically Signed   By: Kathreen Devoid M.D.   On: 09/02/2022 12:07     Objective:  VS:  HT:    WT:   BMI:     BP:(!) 163/93  HR:86bpm  TEMP: ( )  RESP:  Physical Exam Vitals and nursing note reviewed.  Constitutional:      General: She is not in acute distress.    Appearance: Normal appearance. She is not ill-appearing.  HENT:     Head: Normocephalic and atraumatic.     Right Ear: External ear normal.     Left Ear: External ear normal.  Eyes:     Extraocular Movements: Extraocular movements intact.  Cardiovascular:     Rate and Rhythm: Normal rate.     Pulses: Normal pulses.  Pulmonary:     Effort: Pulmonary effort is normal. No respiratory distress.  Abdominal:     General: There is no distension.     Palpations: Abdomen is soft.  Musculoskeletal:        General: Tenderness present.     Cervical back: Neck supple.     Right lower leg: No edema.     Left lower leg: No edema.     Comments: Patient has good distal strength with no pain over the greater trochanters.  No  clonus or focal weakness.  Skin:    Findings: No erythema, lesion or rash.  Neurological:     General: No focal deficit present.     Mental Status: She is alert and oriented to person, place, and time.     Sensory: No sensory deficit.     Motor: No weakness or abnormal muscle tone.     Coordination: Coordination normal.  Psychiatric:        Mood and Affect: Mood normal.        Behavior: Behavior normal.      Imaging: No results found.

## 2022-10-05 NOTE — Telephone Encounter (Signed)
I do feel it is warranted to provide the requested prescription of oxycodone.  Prescription sent in today.  This will be done temporarily.  If this became a more longer-term issue she would need to find appropriate pain management referral.

## 2022-11-17 ENCOUNTER — Telehealth: Payer: Self-pay | Admitting: *Deleted

## 2022-11-17 ENCOUNTER — Other Ambulatory Visit: Payer: Self-pay | Admitting: *Deleted

## 2022-11-17 DIAGNOSIS — Z780 Asymptomatic menopausal state: Secondary | ICD-10-CM

## 2022-11-17 DIAGNOSIS — S22000A Wedge compression fracture of unspecified thoracic vertebra, initial encounter for closed fracture: Secondary | ICD-10-CM

## 2022-11-17 NOTE — Telephone Encounter (Addendum)
Patient contacted the office and advised patient DEXA order placed for [facility], please advise patient to call [facility] to schedule appt. Gave patient the [facility] number and address. Patient verbalized understanding.

## 2022-11-17 NOTE — Telephone Encounter (Signed)
Received a fax of office notes from [facility] Ladona Ridgel reviewed results which states the patient has a subacute compression fracture T9 and T10. Ladona Ridgel recommends scheduling DEXA.   Attempted to contact the patient and left message for patient to call the office.

## 2022-11-17 NOTE — Telephone Encounter (Signed)
DEXA order placed for [facility], please advise patient to call [facility] to schedule appt.

## 2022-12-01 ENCOUNTER — Ambulatory Visit: Payer: BC Managed Care – PPO | Attending: Rheumatology | Admitting: Rheumatology

## 2022-12-01 ENCOUNTER — Ambulatory Visit: Payer: BC Managed Care – PPO

## 2022-12-01 DIAGNOSIS — G5602 Carpal tunnel syndrome, left upper limb: Secondary | ICD-10-CM | POA: Diagnosis not present

## 2022-12-01 MED ORDER — LIDOCAINE HCL 1 % IJ SOLN
0.5000 mL | INTRAMUSCULAR | Status: AC | PRN
  Administered 2022-12-01: .5 mL

## 2022-12-01 MED ORDER — TRIAMCINOLONE ACETONIDE 40 MG/ML IJ SUSP
20.0000 mg | INTRAMUSCULAR | Status: AC | PRN
  Administered 2022-12-01: 20 mg

## 2022-12-01 NOTE — Progress Notes (Signed)
   Procedure Note  Patient: Jamie Mccall             Date of Birth: [birth year]           MRN: [id number]             Visit Date: 12/01/2022  Procedures: Visit Diagnoses:  1. Carpal tunnel syndrome of left wrist   Patient had left carpal tunnel injection on August 28, 2022 and had good relief from it.  She states the symptoms have returned and she would like to have a repeat carpal tunnel injection.  Side effects and complications were reviewed.  Patient wanted to proceed with the injection. Ultrasound guided injection is preferred based studies that show increased duration, increased effect, greater accuracy, decreased procedural pain, increased response rate, and decreased cost with ultrasound guided versus blind injection.   Verbal informed consent obtained.  Time-out conducted.  Noted no overlying erythema, induration, or other signs of local infection. Ultrasound-guided carpal tunnel syndrome injection: After sterile prep with Betadine, injected 0.5 mL of 1% lidocaine and 20 mg Kenalog using a 27-gauge needle, injected the carpal tunnel sheath.   Hand/UE Inj: L carpal tunnel for carpal tunnel syndrome on 12/01/2022 3:33 PM Indications: pain Details: 27 G needle, ultrasound-guided ulnar approach Medications: 0.5 mL lidocaine 1 %; 20 mg triamcinolone acetonide 40 MG/ML Aspirate: 0 mL Outcome: tolerated well, no immediate complications Procedure, treatment alternatives, risks and benefits explained, specific risks discussed. Consent was given by the patient. Immediately prior to procedure a time out was called to verify the correct patient, procedure, equipment, support staff and site/side marked as required. Patient was prepped and draped in the usual sterile fashion.    Patient tolerated the procedure well.  Postprocedure instructions were given.  She was advised to use the carpal tunnel brace.  Pollyann Savoy, MD

## 2022-12-02 ENCOUNTER — Other Ambulatory Visit: Payer: BC Managed Care – PPO | Admitting: Rheumatology

## 2023-01-06 ENCOUNTER — Other Ambulatory Visit (HOSPITAL_BASED_OUTPATIENT_CLINIC_OR_DEPARTMENT_OTHER): Payer: BC Managed Care – PPO

## 2023-01-12 ENCOUNTER — Ambulatory Visit (HOSPITAL_BASED_OUTPATIENT_CLINIC_OR_DEPARTMENT_OTHER)
Admission: RE | Admit: 2023-01-12 | Discharge: 2023-01-12 | Disposition: A | Discharge status: Home / Self Care | Payer: Medicare Other | Source: Ambulatory Visit | Attending: Rheumatology | Admitting: Rheumatology

## 2023-01-12 DIAGNOSIS — S22000A Wedge compression fracture of unspecified thoracic vertebra, initial encounter for closed fracture: Secondary | ICD-10-CM | POA: Diagnosis present

## 2023-01-12 DIAGNOSIS — Z780 Asymptomatic menopausal state: Secondary | ICD-10-CM

## 2023-01-12 NOTE — Progress Notes (Signed)
DEXA scan shows neutropenia.  Please advise patient to take calcium 1200 mg p.o. daily between dietary and supplement.  Regular resistive exercises are advised.

## 2023-01-13 ENCOUNTER — Other Ambulatory Visit: Payer: Self-pay | Admitting: Physician Assistant

## 2023-01-13 NOTE — Telephone Encounter (Signed)
Last Fill: 08/07/2022  Eye exam: 11/06/2022 WNL    Labs: 09/09/2022 Sodium 134, Chloride 97, CO2 32, Hct 45.2, MCV 99.5  Next Visit: 01/27/2023  Last Visit: 08/28/2022  DX: Rheumatoid arthritis involving multiple sites with positive rheumatoid factor   Current Dose per office note 08/28/2022: Plaquenil 200 mg 1 tablet by mouth twice daily Monday through Friday.   Okay to refill Plaquenil?

## 2023-01-13 NOTE — Progress Notes (Deleted)
Office Visit Note  Patient: Jamie Mccall             Date of Birth: Nov 27, 1955           MRN: [id number]             PCP: Richmond Campbell., PA-C Referring: Richmond Campbell., PA-C Visit Date: 01/27/2023 Occupation: @GUAROCC @  Subjective:  No chief complaint on file.   History of Present Illness: Jamie Mccall is a 67 y.o. female ***     Activities of Daily Living:  Patient reports morning stiffness for *** {minute/hour:19697}.   Patient {ACTIONS;DENIES/REPORTS:21021675::"Denies"} nocturnal pain.  Difficulty dressing/grooming: {ACTIONS;DENIES/REPORTS:21021675::"Denies"} Difficulty climbing stairs: {ACTIONS;DENIES/REPORTS:21021675::"Denies"} Difficulty getting out of chair: {ACTIONS;DENIES/REPORTS:21021675::"Denies"} Difficulty using hands for taps, buttons, cutlery, and/or writing: {ACTIONS;DENIES/REPORTS:21021675::"Denies"}  No Rheumatology ROS completed.   PMFS History:  Patient Active Problem List   Diagnosis Date Noted   Compression fracture of T10 vertebra (HCC) 10/05/2022   Chronic midline thoracic back pain 10/05/2022   Bilateral hand pain 04/30/2017   Mass 04/30/2017   High risk medication use 10/13/2016   Degenerative joint disease involving multiple joints 10/13/2016   History of hypertension 10/13/2016   History of depression 10/13/2016   Morning joint stiffness 10/13/2016   Glaucoma suspect, bilateral 02/16/2014   Primary open angle glaucoma (POAG) 01/18/2014   Punctal stenosis, acquired, bilateral 01/18/2014   Epiphora 01/05/2014   Pseudophakia 09/06/2013   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   Ptosis 07/31/2013   PCO (posterior capsular opacification) 03/13/2013   History of corneal transplant 08/12/2012   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HCC) 05/04/2012   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 Merlyn Lot    MASS EXCISION Left 11/05/2016   Procedure: Excision of left foot mass;  Surgeon: Toni Arthurs, MD;  Location: [facility];  Service: Orthopedics;  Laterality: Left;   SQUAMOUS CELL CARCINOMA EXCISION  08/18/2018   right eye   Social History   Social History Narrative   Not on file   Immunization History  Administered Date(s) Administered   Influenza,inj,Quad PF,6+ Mos 08/18/2017, 08/18/2017     Objective: Vital Signs: There were no vitals taken for this visit.   Physical Exam   Musculoskeletal Exam: ***  CDAI Exam: CDAI Score: -- Patient Global: --; Provider Global: -- Swollen: --; Tender: -- Joint Exam 01/27/2023   No joint exam has been documented for this visit   There is currently no information documented on the homunculus. Go to the Rheumatology activity and complete the homunculus joint exam.  Investigation: No additional findings.  Imaging: DG BONE DENSITY (DXA)  Result Date: 01/12/2023 EXAM: DUAL X-RAY ABSORPTIOMETRY (DXA) FOR BONE MINERAL DENSITY IMPRESSION: Patient: Jamie Mccall   Referring Physician: Pollyann Savoy Birth Date: February 20, 1956 [age] years Patient ID: [id number] Height: 64.5 in. Weight: 217.8 lbs. Measured: 01/12/2023 2:56:10 PM (18 SP 3) Sex: Female Ethnicity: White Analyzed: 01/12/2023 2:56:55 PM (18 SP 3) FRAX* Based on femoral neck BMD: DualFemur (Left) 10-year Probability of Fracture Major Osteoporotic Fracture: 20.1 % Hip Fracture:                 3.1 % Population:                  [place] (Caucasian) Risk Factors: History of Fracture (Adult), Rheumatoid Arthritis *FRAX is a Armed forces logistics/support/administrative officer of the [employer] for Metabolic Bone Diseases, a World Science writer (WHO) [employer] [phone]). Referring Physician:  Pollyann Savoy Your patient completed a bone mineral density test using GE Lunar iDXA system (analysis version: 16). Technologist: ALW PATIENT: Name: Jamie Mccall, Jamie Mccall Patient ID: [id number] Birth Date: Dec 08, 1955 Height: 64.5 in. Sex: Female Measured: 01/12/2023 Weight: 217.8 lbs. Indications: Caucasian, Estrogen Deficiency, Family History of Osteoporosis, History of Fracture (Adult), Plaquenil, Post Menopausal, Rheumatoid Arthritis Fractures: Vertebra Treatments: Calcium, Vitamin D ASSESSMENT: The BMD measured at AP Spine L1-L2 is 0.935 g/cm2 with a T-score of -2.0. This patient is considered to have osteopenia/low bone mass according to World Health Organization [facility]) criteria. The scan quality is good. L-3 and L-4 were excluded due to degenerative changes. Site Region Measured Date Measured Age YA BMD Significant CHANGE T-score AP Spine  L1-L2      01/12/2023    64.9         -2.0    0.935 g/cm2 DualFemur Neck Left  01/12/2023    64.9         -1.9    0.767 g/cm2 DualFemur Total Mean 01/12/2023    64.9         -1.5    0.820 g/cm2 World Health Organization [facility]) criteria for post-menopausal, Caucasian Women: Normal       T-score at or above -1 SD Osteopenia   T-score between -1 and -2.5 SD Osteoporosis T-score at or below -2.5 SD RECOMMENDATION: 1. All patients should optimize calcium and vitamin D intake. 2. Consider FDA approved medical therapies in postmenopausal women and men aged 28 years and older, based on the following: a. A hip or vertebral (clinical or morphometric) fracture b. T-score = -2.5 at the femoral neck or spine after appropriate evaluation to exclude secondary causes c. Low  bone mass (T-score between -1.0 and -2.5 at the femoral neck or spine) and a 10- year probability of a hip fracture = 3% or a 10 year probability of a major osteoporosis-related fracture = 20% based on the [place]-adapted WHO algorithm. 3. Clinician judgement and/or patient preference may indicate treatment for people with10-year fracture probabilities above or below these levels. FOLLOW-UP: Patients with diagnosis of osteoporosis or at high risk for fracture should have regular bone mineral density tests . For patients eligible for Medicare routine testing is allowed once every 2 years. The testing frequency can be increased to one year for patients who have rapidly progressing disease, those who are receiving or discontinuing medical therapy to restore bone mass, or have additional risk factors. I have reviewed this study and agree with the findings. [facility] Electronically Signed   By: Bary Richard M.D.   On: 01/12/2023 15:10    Recent Labs: Lab Results  Component Value Date   WBC 5.8 08/28/2022   HGB 14.7 08/28/2022   PLT 266 08/28/2022   NA 134 (L) 08/28/2022  K 4.8 08/28/2022   CL 97 (L) 08/28/2022   CO2 28 08/28/2022   GLUCOSE 87 08/28/2022   BUN 10 08/28/2022   CREATININE 0.63 08/28/2022   BILITOT 0.6 08/28/2022   ALKPHOS 61 10/13/2016   AST 17 08/28/2022   ALT 15 08/28/2022   PROT 6.8 08/28/2022   ALBUMIN 3.7 10/13/2016   CALCIUM 9.2 08/28/2022   GFRAA 108 12/24/2020   QFTBGOLDPLUS NEGATIVE 12/22/2017    Speciality Comments: PLQ Eye Exam: 11/06/2022 WNL @ [facility] Follow up in 1 year  pt states she will not use Enbrel again/ causes Bronchitis,  Procedures:  No procedures performed Allergies: Enbrel [etanercept] and Sulfasalazine   Assessment / Plan:     Visit Diagnoses: Rheumatoid arthritis involving multiple sites with positive rheumatoid factor (HCC)  High risk medication use  Primary osteoarthritis of both hands  Carpal tunnel  syndrome of left wrist  DDD (degenerative disc disease), lumbar  History of corneal transplant  History of hypertension  Compression fracture of thoracic vertebra, unspecified thoracic vertebral level, initial encounter (HCC)  Postmenopausal  Vitamin D deficiency  History of depression  Other fatigue  Orders: No orders of the defined types were placed in this encounter.  No orders of the defined types were placed in this encounter.   Face-to-face time spent with patient was *** minutes. Greater than 50% of time was spent in counseling and coordination of care.  Follow-Up Instructions: No follow-ups on file.   Gearldine Bienenstock, PA-C  Note - This record has been created using Dragon software.  Chart creation errors have been sought, but may not always  have been located. Such creation errors do not reflect on  the standard of medical care.

## 2023-01-26 NOTE — Progress Notes (Signed)
Office Visit Note  Patient: Jamie Mccall             Date of Birth: January 12, 1956           MRN: [id number]             PCP: Richmond Campbell., PA-C Referring: Richmond Campbell., PA-C Visit Date: 02/03/2023 Occupation: @GUAROCC @  Subjective:  Medication management  History of Present Illness: Jamie Mccall is a 67 y.o. female with rheumatoid arthritis, osteoarthritis and degenerative disc disease.  Patient states that she had a lot of lower back pain which is gradually getting better.  She states she was taking a lot of NSAIDs for the back pain.  She continues to have intermittent swelling in her hands which she describes over bilateral second MCP joints.  None of the other joints are painful or swollen.  Left carpal tunnel syndrome symptoms completely resolved after the injection.    Activities of Daily Living:  Patient reports morning stiffness for 0 minutes.   Patient Denies nocturnal pain.  Difficulty dressing/grooming: Denies Difficulty climbing stairs: Reports Difficulty getting out of chair: Denies Difficulty using hands for taps, buttons, cutlery, and/or writing: Denies  Review of Systems  Constitutional:  Negative for fatigue.  HENT:  Negative for mouth sores and mouth dryness.   Eyes:  Negative for dryness.  Respiratory:  Negative for shortness of breath.   Cardiovascular:  Negative for chest pain and palpitations.  Gastrointestinal:  Negative for blood in stool, constipation and diarrhea.  Endocrine: Negative for increased urination.  Genitourinary:  Negative for involuntary urination.  Musculoskeletal:  Negative for joint pain, gait problem, joint pain, joint swelling, myalgias, muscle weakness, morning stiffness, muscle tenderness and myalgias.  Skin:  Negative for color change, rash, hair loss and sensitivity to sunlight.  Allergic/Immunologic: Negative for susceptible to infections.  Neurological:  Negative for dizziness and headaches.  Hematological:   Negative for swollen glands.  Psychiatric/Behavioral:  Negative for depressed mood and sleep disturbance. The patient is not nervous/anxious.     PMFS History:  Patient Active Problem List   Diagnosis Date Noted   Compression fracture of T10 vertebra (HCC) 10/05/2022   Chronic midline thoracic back pain 10/05/2022   Bilateral hand pain 04/30/2017   Mass 04/30/2017   High risk medication use 10/13/2016   Degenerative joint disease involving multiple joints 10/13/2016   History of hypertension 10/13/2016   History of depression 10/13/2016   Morning joint stiffness 10/13/2016   Glaucoma suspect, bilateral 02/16/2014   Primary open angle glaucoma (POAG) 01/18/2014   Punctal stenosis, acquired, bilateral 01/18/2014   Epiphora 01/05/2014   Pseudophakia 09/06/2013   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   Ptosis 07/31/2013   PCO (posterior capsular opacification) 03/13/2013   History of corneal transplant 08/12/2012   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HCC) 05/04/2012   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 Merlyn Lot    MASS EXCISION Left 11/05/2016   Procedure: Excision of left foot mass;  Surgeon: Toni Arthurs, MD;  Location: [facility];  Service: Orthopedics;  Laterality: Left;   SQUAMOUS CELL CARCINOMA EXCISION  08/18/2018   right eye    Social History   Social History Narrative   Not on file   Immunization History  Administered Date(s) Administered   Influenza,inj,Quad PF,6+ Mos 08/18/2017, 08/18/2017     Objective: Vital Signs: BP (!) 152/79 (BP Location: Right Arm, Patient Position: Sitting, Cuff Size: Large)   Pulse 78   Resp 16   Ht 5' 4.5" (1.638 m)   Wt 209 lb 6.4 oz (95 kg)   BMI 35.39 kg/m    Physical Exam Vitals and nursing note reviewed.  Constitutional:      Appearance: She is well-developed.  HENT:     Head: Normocephalic and atraumatic.  Eyes:     Conjunctiva/sclera: Conjunctivae normal.  Cardiovascular:     Rate and Rhythm: Normal rate and regular rhythm.     Heart sounds: Normal heart sounds.  Pulmonary:     Effort: Pulmonary effort is normal.     Breath sounds: Normal breath sounds.  Abdominal:     General: Bowel sounds are normal.     Palpations: Abdomen is soft.  Musculoskeletal:     Cervical back: Normal range of motion.  Lymphadenopathy:     Cervical: No cervical adenopathy.  Skin:    General: Skin is warm and dry.     Capillary Refill: Capillary refill takes less than 2 seconds.  Neurological:     Mental Status: She is alert and oriented to person, place, and time.  Psychiatric:        Behavior: Behavior normal.      Musculoskeletal Exam: Cervical spine was in good range of motion.  Shoulder joints, elbow joints, wrist joints were in good range of motion.  She had synovitis over bilateral second MCP joints.  Rheumatoid nodule was also noted on her left index finger.  Hip joints and knee joints in good range of motion.  She had no tenderness over ankles or MTPs.  CDAI Exam: CDAI Score: 9  Patient Global: 10 / 100; Provider Global: 40 / 100 Swollen: 2 ; Tender: 2  Joint Exam 02/03/2023      Right  Left  MCP 2  Swollen Tender  Swollen Tender     Investigation: No additional findings.  Imaging: DG BONE DENSITY (DXA)  Result Date: 01/12/2023 EXAM: DUAL X-RAY  ABSORPTIOMETRY (DXA) FOR BONE MINERAL DENSITY IMPRESSION: Patient: Jamie Mccall   Referring Physician: Pollyann Savoy Birth Date: Aug 12, 1955 [age] Patient ID: [id number] Height: 64.5 in. Weight: 217.8 lbs. Measured: 01/12/2023 2:56:10 PM (18 SP 3) Sex: Female Ethnicity: White Analyzed: 01/12/2023 2:56:55 PM (18 SP 3) FRAX* Based on femoral neck BMD: DualFemur (Left) 10-year Probability of Fracture Major Osteoporotic Fracture: 20.1 % Hip Fracture:                3.1 % Population:                  [place] (Caucasian) Risk Factors: History of Fracture (Adult), Rheumatoid Arthritis *FRAX is a Armed forces logistics/support/administrative officer of the [employer] for Metabolic Bone Diseases, a World Science writer (WHO) [employer] (  [id number]). Referring Physician:  Pollyann Savoy Your patient completed a bone mineral density test using GE Lunar iDXA system (analysis version: 16). Technologist: ALW PATIENT: Name: Jamie Mccall, Jamie Mccall Patient ID: [id number] Birth Date: [birth year] Height: 64.5 in. Sex: Female Measured: 01/12/2023 Weight: 217.8 lbs. Indications: Caucasian, Estrogen Deficiency, Family History of Osteoporosis, History of Fracture (Adult), Plaquenil, Post Menopausal, Rheumatoid Arthritis Fractures: Vertebra Treatments: Calcium, Vitamin D ASSESSMENT: The BMD measured at AP Spine L1-L2 is 0.935 g/cm2 with a T-score of -2.0. This patient is considered to have osteopenia/low bone mass according to World Health Organization [facility]) criteria. The scan quality is good. L-3 and L-4 were excluded due to degenerative changes. Site Region Measured Date Measured Age YA BMD Significant CHANGE T-score AP Spine  L1-L2      01/12/2023    64.9         -2.0    0.935 g/cm2 DualFemur Neck Left  01/12/2023    64.9         -1.9    0.767 g/cm2 DualFemur Total Mean 01/12/2023    64.9         -1.5    0.820 g/cm2 World Health Organization [facility]) criteria for post-menopausal, Caucasian Women: Normal       T-score  at or above -1 SD Osteopenia   T-score between -1 and -2.5 SD Osteoporosis T-score at or below -2.5 SD RECOMMENDATION: 1. All patients should optimize calcium and vitamin D intake. 2. Consider FDA approved medical therapies in postmenopausal women and men aged 78 years and older, based on the following: a. A hip or vertebral (clinical or morphometric) fracture b. T-score = -2.5 at the femoral neck or spine after appropriate evaluation to exclude secondary causes c. Low bone mass (T-score between -1.0 and -2.5 at the femoral neck or spine) and a 10- year probability of a hip fracture = 3% or a 10 year probability of a major osteoporosis-related fracture = 20% based on the [place]-adapted WHO algorithm. 3. Clinician judgement and/or patient preference may indicate treatment for people with10-year fracture probabilities above or below these levels. FOLLOW-UP: Patients with diagnosis of osteoporosis or at high risk for fracture should have regular bone mineral density tests . For patients eligible for Medicare routine testing is allowed once every 2 years. The testing frequency can be increased to one year for patients who have rapidly progressing disease, those who are receiving or discontinuing medical therapy to restore bone mass, or have additional risk factors. I have reviewed this study and agree with the findings. [facility] Electronically Signed   By: Bary Richard M.D.   On: 01/12/2023 15:10    Recent Labs: Lab Results  Component Value Date   WBC 5.8 08/28/2022   HGB 14.7 08/28/2022   PLT 266 08/28/2022   NA 134 (L) 08/28/2022   K 4.8 08/28/2022   CL 97 (L) 08/28/2022   CO2 28 08/28/2022   GLUCOSE 87 08/28/2022   BUN 10 08/28/2022   CREATININE 0.63 08/28/2022   BILITOT 0.6 08/28/2022   ALKPHOS 61 10/13/2016   AST 17 08/28/2022   ALT 15 08/28/2022   PROT 6.8 08/28/2022   ALBUMIN 3.7 10/13/2016   CALCIUM 9.2 08/28/2022   GFRAA 108 12/24/2020   QFTBGOLDPLUS NEGATIVE 12/22/2017     Speciality Comments: PLQ Eye Exam: 11/06/2022 WNL @ [facility] Follow up in 1 year  pt states she will not use Enbrel again/ causes Bronchitis,  Procedures:  No procedures performed Allergies: Enbrel [etanercept] and Sulfasalazine  Assessment / Plan:     Visit Diagnoses: Rheumatoid arthritis involving multiple sites with positive rheumatoid factor (HCC) - positive RF, positive anti-CCP, +14 3 3  eta.  Patient denies having a flare of rheumatoid arthritis.  She had synovitis over bilateral second MTP joints as usual.  She is satisfied with the current treatment and does not want more aggressive therapy.  High risk medication use - Plaquenil 200 mg 1 tablet by mouth twice daily Monday through Friday. -Eye examination on November 06, 2022 was within normal limits.  She gets annual exam.  Labs obtained on August 28, 2022 CBC and CMP were normal.  Will check labs today.  Plan: CBC with Differential/Platelet, COMPLETE METABOLIC PANEL WITH GFR.  Information on immunization was placed in the AVS.  Carpal tunnel syndrome of left wrist - left carpal tunnel injection on 12/01/2022.  Symptoms resolved after the injection.  She has been using a carpal tunnel brace.  Primary osteoarthritis of both hands-she has rheumatoid arthritis and osteoarthritis overlap.  She denies discomfort in her hands.  Pain in thoracic spine - She will follow-up with Dr. Alvester Morin.  DDD (degenerative disc disease), lumbar - She had MRI of the lumbar spine on March 20, 2022.  Patient states she had injections by Dr. Alvester Morin and had some pain relief.  Patient states she was having a lot of lower back pain.  She was taking over-the-counter NSAIDs.  The pain has gradually improved.  History of corneal transplant - Followed by ophthalmologist at [facility].  History of hypertension-blood pressure was elevated at 152/79 and repeat blood pressure was 165/74.  She was advised to monitor blood pressure closely and  follow-up with PCP.  History of depression  Vitamin D deficiency -she has history of vitamin D deficiency.  She is currently not taking vitamin D.  Plan: VITAMIN D 25 Hydroxy (Vit-D Deficiency, Fractures)  Other fatigue  Postmenopausal - She has had previous DEXA scan which was normal per patient.  Osteopenia-DEXA scan from January 12, 2023 showed T-score of -2.0 AP spine.  Use of calcium rich diet vitamin D was advised.  She will have repeat DEXA scan in 2 years.  Orders: Orders Placed This Encounter  Procedures   CBC with Differential/Platelet   COMPLETE METABOLIC PANEL WITH GFR   VITAMIN D 25 Hydroxy (Vit-D Deficiency, Fractures)   No orders of the defined types were placed in this encounter.    Follow-Up Instructions: Return in about 5 months (around 07/06/2023) for Rheumatoid arthritis.   Pollyann Savoy, MD  Note - This record has been created using Animal nutritionist.  Chart creation errors have been sought, but may not always  have been located. Such creation errors do not reflect on  the standard of medical care.

## 2023-01-27 ENCOUNTER — Ambulatory Visit: Payer: BC Managed Care – PPO | Admitting: Physician Assistant

## 2023-01-27 DIAGNOSIS — Z79899 Other long term (current) drug therapy: Secondary | ICD-10-CM

## 2023-01-27 DIAGNOSIS — Z947 Corneal transplant status: Secondary | ICD-10-CM

## 2023-01-27 DIAGNOSIS — R5383 Other fatigue: Secondary | ICD-10-CM

## 2023-01-27 DIAGNOSIS — S22000A Wedge compression fracture of unspecified thoracic vertebra, initial encounter for closed fracture: Secondary | ICD-10-CM

## 2023-01-27 DIAGNOSIS — M5136 Other intervertebral disc degeneration, lumbar region: Secondary | ICD-10-CM

## 2023-01-27 DIAGNOSIS — Z8659 Personal history of other mental and behavioral disorders: Secondary | ICD-10-CM

## 2023-01-27 DIAGNOSIS — G5602 Carpal tunnel syndrome, left upper limb: Secondary | ICD-10-CM

## 2023-01-27 DIAGNOSIS — M0579 Rheumatoid arthritis with rheumatoid factor of multiple sites without organ or systems involvement: Secondary | ICD-10-CM

## 2023-01-27 DIAGNOSIS — Z8679 Personal history of other diseases of the circulatory system: Secondary | ICD-10-CM

## 2023-01-27 DIAGNOSIS — M19041 Primary osteoarthritis, right hand: Secondary | ICD-10-CM

## 2023-01-27 DIAGNOSIS — E559 Vitamin D deficiency, unspecified: Secondary | ICD-10-CM

## 2023-01-27 DIAGNOSIS — M8589 Other specified disorders of bone density and structure, multiple sites: Secondary | ICD-10-CM

## 2023-02-03 ENCOUNTER — Encounter: Payer: Self-pay | Admitting: Rheumatology

## 2023-02-03 ENCOUNTER — Ambulatory Visit: Payer: Medicare Other | Attending: Physician Assistant | Admitting: Rheumatology

## 2023-02-03 VITALS — BP 165/74 | HR 79 | Resp 16 | Ht 64.5 in | Wt 209.4 lb

## 2023-02-03 DIAGNOSIS — M19041 Primary osteoarthritis, right hand: Secondary | ICD-10-CM

## 2023-02-03 DIAGNOSIS — Z8679 Personal history of other diseases of the circulatory system: Secondary | ICD-10-CM

## 2023-02-03 DIAGNOSIS — M0579 Rheumatoid arthritis with rheumatoid factor of multiple sites without organ or systems involvement: Secondary | ICD-10-CM | POA: Diagnosis present

## 2023-02-03 DIAGNOSIS — E559 Vitamin D deficiency, unspecified: Secondary | ICD-10-CM | POA: Diagnosis present

## 2023-02-03 DIAGNOSIS — M8589 Other specified disorders of bone density and structure, multiple sites: Secondary | ICD-10-CM

## 2023-02-03 DIAGNOSIS — M51369 Other intervertebral disc degeneration, lumbar region without mention of lumbar back pain or lower extremity pain: Secondary | ICD-10-CM

## 2023-02-03 DIAGNOSIS — M19042 Primary osteoarthritis, left hand: Secondary | ICD-10-CM | POA: Insufficient documentation

## 2023-02-03 DIAGNOSIS — M5136 Other intervertebral disc degeneration, lumbar region: Secondary | ICD-10-CM | POA: Insufficient documentation

## 2023-02-03 DIAGNOSIS — Z947 Corneal transplant status: Secondary | ICD-10-CM

## 2023-02-03 DIAGNOSIS — M546 Pain in thoracic spine: Secondary | ICD-10-CM

## 2023-02-03 DIAGNOSIS — G5602 Carpal tunnel syndrome, left upper limb: Secondary | ICD-10-CM

## 2023-02-03 DIAGNOSIS — Z8659 Personal history of other mental and behavioral disorders: Secondary | ICD-10-CM | POA: Diagnosis present

## 2023-02-03 DIAGNOSIS — Z79899 Other long term (current) drug therapy: Secondary | ICD-10-CM

## 2023-02-03 DIAGNOSIS — R5383 Other fatigue: Secondary | ICD-10-CM

## 2023-02-03 DIAGNOSIS — Z780 Asymptomatic menopausal state: Secondary | ICD-10-CM | POA: Diagnosis present

## 2023-02-03 LAB — CBC WITH DIFFERENTIAL/PLATELET
Basophils Absolute: 60 cells/uL (ref 0–200)
Lymphs Abs: 1581 cells/uL (ref 850–3900)
Neutro Abs: 4322 cells/uL (ref 1500–7800)
Platelets: 314 10*3/uL (ref 140–400)
RBC: 4.48 10*6/uL (ref 3.80–5.10)
Total Lymphocyte: 23.6 %
WBC: 6.7 10*3/uL (ref 3.8–10.8)

## 2023-02-03 NOTE — Patient Instructions (Signed)
Standing Labs We placed an order today for your standing lab work.   Please have your standing labs drawn in November  Please have your labs drawn 2 weeks prior to your appointment so that the provider can discuss your lab results at your appointment, if possible.  Please note that you may see your imaging and lab results in MyChart before we have reviewed them. We will contact you once all results are reviewed. Please allow our office up to 72 hours to thoroughly review all of the results before contacting the office for clarification of your results.  WALK-IN LAB HOURS  Monday through Thursday from 8:00 am -12:30 pm and 1:00 pm-5:00 pm and Friday from 8:00 am-12:00 pm.  Patients with office visits requiring labs will be seen before walk-in labs.  You may encounter longer than normal wait times. Please allow additional time. Wait times may be shorter on  Monday and Thursday afternoons.  We do not book appointments for walk-in labs. We appreciate your patience and understanding with our staff.   Labs are drawn by [facility]. Please bring your co-pay at the time of your lab draw.  You may receive a bill from [facility] for your lab work.  Please note if you are on Hydroxychloroquine and and an order has been placed for a Hydroxychloroquine level,  you will need to have it drawn 4 hours or more after your last dose.  If you wish to have your labs drawn at another location, please call the office 24 hours in advance so we can fax the orders.  The office is located at [address], [address], [place], [place] [address]   If you have any questions regarding directions or hours of operation,  please call [phone].   As a reminder, please drink plenty of water prior to coming for your lab work. Thanks!   Vaccines You are taking a medication(s) that can suppress your immune system.  The following immunizations are recommended: Flu annually Covid-19  Td/Tdap (tetanus, diphtheria, pertussis)  every 10 years Pneumonia (Prevnar 15 then Pneumovax 23 at least 1 year apart.  Alternatively, can take Prevnar 20 without needing additional dose) Shingrix: 2 doses from 4 weeks to 6 months apart  Please check with your PCP to make sure you are up to date.  

## 2023-02-04 LAB — CBC WITH DIFFERENTIAL/PLATELET
Absolute Monocytes: 596 cells/uL (ref 200–950)
Basophils Relative: 0.9 %
Eosinophils Absolute: 141 cells/uL (ref 15–500)
Eosinophils Relative: 2.1 %
HCT: 43.8 % (ref 35.0–45.0)
Hemoglobin: 15 g/dL (ref 11.7–15.5)
MCH: 33.5 pg — ABNORMAL HIGH (ref 27.0–33.0)
MCHC: 34.2 g/dL (ref 32.0–36.0)
MCV: 97.8 fL (ref 80.0–100.0)
MPV: 9.4 fL (ref 7.5–12.5)
Monocytes Relative: 8.9 %
Neutrophils Relative %: 64.5 %
RDW: 11.8 % (ref 11.0–15.0)

## 2023-02-04 LAB — COMPLETE METABOLIC PANEL WITH GFR
AG Ratio: 1.8 (calc) (ref 1.0–2.5)
ALT: 19 U/L (ref 6–29)
AST: 18 U/L (ref 10–35)
Albumin: 4.1 g/dL (ref 3.6–5.1)
Alkaline phosphatase (APISO): 74 U/L (ref 37–153)
BUN: 14 mg/dL (ref 7–25)
CO2: 28 mmol/L (ref 20–32)
Calcium: 9.6 mg/dL (ref 8.6–10.4)
Chloride: 98 mmol/L (ref 98–110)
Creat: 0.68 mg/dL (ref 0.50–1.05)
Globulin: 2.3 g/dL (calc) (ref 1.9–3.7)
Glucose, Bld: 90 mg/dL (ref 65–99)
Potassium: 4.6 mmol/L (ref 3.5–5.3)
Sodium: 135 mmol/L (ref 135–146)
Total Bilirubin: 0.5 mg/dL (ref 0.2–1.2)
Total Protein: 6.4 g/dL (ref 6.1–8.1)
eGFR: 97 mL/min/{1.73_m2} (ref >=60)

## 2023-02-04 LAB — VITAMIN D 25 HYDROXY (VIT D DEFICIENCY, FRACTURES): Vit D, 25-Hydroxy: 56 ng/mL (ref 30–100)

## 2023-02-04 NOTE — Progress Notes (Signed)
CBC and CMP are normal.  Vitamin D is in the desirable range.

## 2023-04-07 ENCOUNTER — Other Ambulatory Visit: Payer: Self-pay | Admitting: Physician Assistant

## 2023-04-07 NOTE — Telephone Encounter (Signed)
Last Fill: 01/13/2023  Eye exam: 11/06/2022 WNL    Labs: 02/03/2023 CBC and CMP are normal.   Next Visit: 07/13/2023  Last Visit: 02/03/2023  [id number] arthritis involving multiple sites with positive rheumatoid factor   Current Dose per office note 02/03/2023: Plaquenil 200 mg 1 tablet by mouth twice daily Monday through Friday.   Okay to refill Plaquenil?

## 2023-06-29 NOTE — Progress Notes (Unsigned)
Office Visit Note  Patient: Jamie Mccall             Date of Birth: August 21, 1955           MRN: [id number]             PCP: Richmond Campbell., PA-C Referring: Richmond Campbell., PA-C Visit Date: 07/13/2023 Occupation: @GUAROCC @  Subjective:  No chief complaint on file.   History of Present Illness: Jamie Mccall is a 67 y.o. female ***     Activities of Daily Living:  Patient reports morning stiffness for *** {minute/hour:19697}.   Patient {ACTIONS;DENIES/REPORTS:21021675::"Denies"} nocturnal pain.  Difficulty dressing/grooming: {ACTIONS;DENIES/REPORTS:21021675::"Denies"} Difficulty climbing stairs: {ACTIONS;DENIES/REPORTS:21021675::"Denies"} Difficulty getting out of chair: {ACTIONS;DENIES/REPORTS:21021675::"Denies"} Difficulty using hands for taps, buttons, cutlery, and/or writing: {ACTIONS;DENIES/REPORTS:21021675::"Denies"}  No Rheumatology ROS completed.   PMFS History:  Patient Active Problem List   Diagnosis Date Noted   Compression fracture of T10 vertebra (HCC) 10/05/2022   Chronic midline thoracic back pain 10/05/2022   Bilateral hand pain 04/30/2017   Mass 04/30/2017   High risk medication use 10/13/2016   Degenerative joint disease involving multiple joints 10/13/2016   History of hypertension 10/13/2016   History of depression 10/13/2016   Morning joint stiffness 10/13/2016   Glaucoma suspect, bilateral 02/16/2014   Primary open angle glaucoma (POAG) 01/18/2014   Punctal stenosis, acquired, bilateral 01/18/2014   Epiphora 01/05/2014   Pseudophakia 09/06/2013   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   Ptosis 07/31/2013   PCO (posterior capsular opacification) 03/13/2013   History of corneal transplant 08/12/2012   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HCC) 05/04/2012   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 Merlyn Lot    MASS EXCISION Left 11/05/2016   Procedure: Excision of left foot mass;  Surgeon: Toni Arthurs, MD;  Location: [facility];  Service: Orthopedics;  Laterality: Left;   SQUAMOUS CELL CARCINOMA EXCISION  08/18/2018   right eye   Social History   Social History Narrative   Not on file   Immunization History  Administered Date(s) Administered   Influenza,inj,Quad PF,6+ Mos 08/18/2017, 08/18/2017     Objective: Vital Signs: There were no vitals taken for this visit.   Physical Exam   Musculoskeletal Exam: ***  CDAI Exam: CDAI Score: -- Patient Global: --; Provider Global: -- Swollen: --; Tender: -- Joint Exam 07/13/2023   No joint exam has been documented for this visit   There is currently no information documented on the homunculus. Go to the Rheumatology activity and complete the homunculus joint exam.  Investigation: No additional findings.  Imaging: No results found.  Recent Labs: Lab Results  Component Value Date   WBC 6.7 02/03/2023   HGB 15.0 02/03/2023   PLT 314 02/03/2023   NA 135  02/03/2023   K 4.6 02/03/2023   CL 98 02/03/2023   CO2 28 02/03/2023   GLUCOSE 90 02/03/2023   BUN 14 02/03/2023   CREATININE 0.68 02/03/2023   BILITOT 0.5 02/03/2023   ALKPHOS 61 10/13/2016   AST 18 02/03/2023   ALT 19 02/03/2023   PROT 6.4 02/03/2023   ALBUMIN 3.7 10/13/2016   CALCIUM 9.6 02/03/2023   GFRAA 108 12/24/2020   QFTBGOLDPLUS NEGATIVE  12/22/2017    Speciality Comments: PLQ Eye Exam: 11/06/2022 WNL @ [facility] Follow up in 1 year  pt states she will not use Enbrel again/ causes Bronchitis,  Procedures:  No procedures performed Allergies: Enbrel [etanercept] and Sulfasalazine   Assessment / Plan:     Visit Diagnoses: Rheumatoid arthritis involving multiple sites with positive rheumatoid factor (HCC)  High risk medication use  Carpal tunnel syndrome of left wrist  Primary osteoarthritis of both hands  Pain in thoracic spine  Degeneration of intervertebral disc of lumbar region without discogenic back pain or lower extremity pain  History of corneal transplant  History of hypertension  History of depression  Vitamin D deficiency  Other fatigue  Osteopenia of multiple sites  Compression fracture of thoracic vertebra, unspecified thoracic vertebral level, initial encounter (HCC)  Orders: No orders of the defined types were placed in this encounter.  No orders of the defined types were placed in this encounter.   Face-to-face time spent with patient was *** minutes. Greater than 50% of time was spent in counseling and coordination of care.  Follow-Up Instructions: No follow-ups on file.   Gearldine Bienenstock, PA-C  Note - This record has been created using Dragon software.  Chart creation errors have been sought, but may not always  have been located. Such creation errors do not reflect on  the standard of medical care.

## 2023-07-07 ENCOUNTER — Other Ambulatory Visit: Payer: Self-pay | Admitting: Physician Assistant

## 2023-07-07 NOTE — Telephone Encounter (Signed)
Last Fill: 04/07/2023  Eye exam: 11/06/2022 WNL    Labs: 02/03/2023 CBC and CMP are normal.   Next Visit: 07/13/2023  Last Visit: 02/03/2023  [id number] arthritis involving multiple sites with positive rheumatoid factor   Current Dose per office note 02/03/2023: Plaquenil 200 mg 1 tablet by mouth twice daily Monday through Friday.   Okay to refill Plaquenil?

## 2023-07-13 ENCOUNTER — Ambulatory Visit: Payer: Medicare Other | Admitting: Physician Assistant

## 2023-07-13 DIAGNOSIS — G5602 Carpal tunnel syndrome, left upper limb: Secondary | ICD-10-CM

## 2023-07-13 DIAGNOSIS — E559 Vitamin D deficiency, unspecified: Secondary | ICD-10-CM

## 2023-07-13 DIAGNOSIS — M19041 Primary osteoarthritis, right hand: Secondary | ICD-10-CM

## 2023-07-13 DIAGNOSIS — Z79899 Other long term (current) drug therapy: Secondary | ICD-10-CM

## 2023-07-13 DIAGNOSIS — Z8659 Personal history of other mental and behavioral disorders: Secondary | ICD-10-CM

## 2023-07-13 DIAGNOSIS — M0579 Rheumatoid arthritis with rheumatoid factor of multiple sites without organ or systems involvement: Secondary | ICD-10-CM

## 2023-07-13 DIAGNOSIS — M51369 Other intervertebral disc degeneration, lumbar region without mention of lumbar back pain or lower extremity pain: Secondary | ICD-10-CM

## 2023-07-13 DIAGNOSIS — M8589 Other specified disorders of bone density and structure, multiple sites: Secondary | ICD-10-CM

## 2023-07-13 DIAGNOSIS — Z947 Corneal transplant status: Secondary | ICD-10-CM

## 2023-07-13 DIAGNOSIS — S22000A Wedge compression fracture of unspecified thoracic vertebra, initial encounter for closed fracture: Secondary | ICD-10-CM

## 2023-07-13 DIAGNOSIS — R5383 Other fatigue: Secondary | ICD-10-CM

## 2023-07-13 DIAGNOSIS — M546 Pain in thoracic spine: Secondary | ICD-10-CM

## 2023-07-13 DIAGNOSIS — Z8679 Personal history of other diseases of the circulatory system: Secondary | ICD-10-CM

## 2023-07-30 NOTE — Progress Notes (Unsigned)
Office Visit Note  Patient: Jamie Mccall             Date of Birth: March 27, 1956           MRN: [id number]             PCP: Richmond Campbell., PA-C Referring: Richmond Campbell., PA-C Visit Date: 08/02/2023 Occupation: @GUAROCC @  Subjective:  No chief complaint on file.   History of Present Illness: Jamie Mccall is a 67 y.o. female ***     Activities of Daily Living:  Patient reports morning stiffness for *** {minute/hour:19697}.   Patient {ACTIONS;DENIES/REPORTS:21021675::"Denies"} nocturnal pain.  Difficulty dressing/grooming: {ACTIONS;DENIES/REPORTS:21021675::"Denies"} Difficulty climbing stairs: {ACTIONS;DENIES/REPORTS:21021675::"Denies"} Difficulty getting out of chair: {ACTIONS;DENIES/REPORTS:21021675::"Denies"} Difficulty using hands for taps, buttons, cutlery, and/or writing: {ACTIONS;DENIES/REPORTS:21021675::"Denies"}  No Rheumatology ROS completed.   PMFS History:  Patient Active Problem List   Diagnosis Date Noted   Compression fracture of T10 vertebra (HCC) 10/05/2022   Chronic midline thoracic back pain 10/05/2022   Bilateral hand pain 04/30/2017   Mass 04/30/2017   High risk medication use 10/13/2016   Degenerative joint disease involving multiple joints 10/13/2016   History of hypertension 10/13/2016   History of depression 10/13/2016   Morning joint stiffness 10/13/2016   Glaucoma suspect, bilateral 02/16/2014   Primary open angle glaucoma (POAG) 01/18/2014   Punctal stenosis, acquired, bilateral 01/18/2014   Epiphora 01/05/2014   Pseudophakia 09/06/2013   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   Ptosis 07/31/2013   PCO (posterior capsular opacification) 03/13/2013   History of corneal transplant 08/12/2012   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HCC) 05/04/2012   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 Merlyn Lot    MASS EXCISION Left 11/05/2016   Procedure: Excision of left foot mass;  Surgeon: Toni Arthurs, MD;  Location: [facility];  Service: Orthopedics;  Laterality: Left;   SQUAMOUS CELL CARCINOMA EXCISION  08/18/2018   right eye   Social History   Social History Narrative   Not on file   Immunization History  Administered Date(s) Administered   Influenza,inj,Quad PF,6+ Mos 08/18/2017, 08/18/2017     Objective: Vital Signs: There were no vitals taken for this visit.   Physical Exam   Musculoskeletal Exam: ***  CDAI Exam: CDAI Score: -- Patient Global: --; Provider Global: -- Swollen: --; Tender: -- Joint Exam 08/02/2023   No joint exam has been documented for this visit   There is currently no information documented on the homunculus. Go to the Rheumatology activity and complete the homunculus joint exam.  Investigation: No additional findings.  Imaging: No results found.  Recent Labs: Lab Results  Component Value Date   WBC 6.7 02/03/2023   HGB 15.0 02/03/2023   PLT 314 02/03/2023   NA 135  02/03/2023   K 4.6 02/03/2023   CL 98 02/03/2023   CO2 28 02/03/2023   GLUCOSE 90 02/03/2023   BUN 14 02/03/2023   CREATININE 0.68 02/03/2023   BILITOT 0.5 02/03/2023   ALKPHOS 61 10/13/2016   AST 18 02/03/2023   ALT 19 02/03/2023   PROT 6.4 02/03/2023   ALBUMIN 3.7 10/13/2016   CALCIUM 9.6 02/03/2023   GFRAA 108 12/24/2020   QFTBGOLDPLUS NEGATIVE  12/22/2017    Speciality Comments: PLQ Eye Exam: 11/06/2022 WNL @ [facility] Follow up in 1 year  pt states she will not use Enbrel again/ causes Bronchitis,  Procedures:  No procedures performed Allergies: Enbrel [etanercept] and Sulfasalazine   Assessment / Plan:     Visit Diagnoses: No diagnosis found.  Orders: No orders of the defined types were placed in this encounter.  No orders of the defined types were placed in this encounter.   Face-to-face time spent with patient was *** minutes. Greater than 50% of time was spent in counseling and coordination of care.  Follow-Up Instructions: No follow-ups on file.   Ellen Henri, CMA  Note - This record has been created using Animal nutritionist.  Chart creation errors have been sought, but may not always  have been located. Such creation errors do not reflect on  the standard of medical care.

## 2023-08-02 ENCOUNTER — Encounter: Payer: Self-pay | Admitting: Rheumatology

## 2023-08-02 ENCOUNTER — Ambulatory Visit: Payer: Medicare Other

## 2023-08-02 ENCOUNTER — Ambulatory Visit: Payer: Medicare Other | Attending: Rheumatology | Admitting: Rheumatology

## 2023-08-02 VITALS — BP 139/68 | HR 83 | Wt 206.0 lb

## 2023-08-02 DIAGNOSIS — M19042 Primary osteoarthritis, left hand: Secondary | ICD-10-CM | POA: Insufficient documentation

## 2023-08-02 DIAGNOSIS — M19041 Primary osteoarthritis, right hand: Secondary | ICD-10-CM | POA: Diagnosis present

## 2023-08-02 DIAGNOSIS — M0579 Rheumatoid arthritis with rheumatoid factor of multiple sites without organ or systems involvement: Secondary | ICD-10-CM | POA: Diagnosis present

## 2023-08-02 DIAGNOSIS — E559 Vitamin D deficiency, unspecified: Secondary | ICD-10-CM | POA: Diagnosis present

## 2023-08-02 DIAGNOSIS — R5383 Other fatigue: Secondary | ICD-10-CM

## 2023-08-02 DIAGNOSIS — Z8659 Personal history of other mental and behavioral disorders: Secondary | ICD-10-CM | POA: Diagnosis present

## 2023-08-02 DIAGNOSIS — G5602 Carpal tunnel syndrome, left upper limb: Secondary | ICD-10-CM

## 2023-08-02 DIAGNOSIS — Z780 Asymptomatic menopausal state: Secondary | ICD-10-CM | POA: Diagnosis present

## 2023-08-02 DIAGNOSIS — Z947 Corneal transplant status: Secondary | ICD-10-CM | POA: Diagnosis present

## 2023-08-02 DIAGNOSIS — M47816 Spondylosis without myelopathy or radiculopathy, lumbar region: Secondary | ICD-10-CM | POA: Diagnosis present

## 2023-08-02 DIAGNOSIS — Z79899 Other long term (current) drug therapy: Secondary | ICD-10-CM | POA: Diagnosis present

## 2023-08-02 DIAGNOSIS — M546 Pain in thoracic spine: Secondary | ICD-10-CM

## 2023-08-02 DIAGNOSIS — Z8679 Personal history of other diseases of the circulatory system: Secondary | ICD-10-CM | POA: Diagnosis present

## 2023-08-02 DIAGNOSIS — M8589 Other specified disorders of bone density and structure, multiple sites: Secondary | ICD-10-CM | POA: Diagnosis present

## 2023-08-02 MED ORDER — TRIAMCINOLONE ACETONIDE 40 MG/ML IJ SUSP
10.0000 mg | INTRAMUSCULAR | Status: AC | PRN
  Administered 2023-08-02: 10 mg

## 2023-08-02 MED ORDER — LIDOCAINE HCL 1 % IJ SOLN
0.5000 mL | INTRAMUSCULAR | Status: AC | PRN
  Administered 2023-08-02: .5 mL

## 2023-08-02 NOTE — Patient Instructions (Signed)
Vaccines You are taking a medication(s) that can suppress your immune system.  The following immunizations are recommended: Flu annually Covid-19  Td/Tdap (tetanus, diphtheria, pertussis) every 10 years Pneumonia (Prevnar 15 then Pneumovax 23 at least 1 year apart.  Alternatively, can take Prevnar 20 without needing additional dose) Shingrix: 2 doses from 4 weeks to 6 months apart  Please check with your PCP to make sure you are up to date.  

## 2023-08-03 LAB — CBC WITH DIFFERENTIAL/PLATELET
Absolute Lymphocytes: 1208 {cells}/uL (ref 850–3900)
Absolute Monocytes: 551 {cells}/uL (ref 200–950)
Basophils Absolute: 48 {cells}/uL (ref 0–200)
Basophils Relative: 0.9 %
Eosinophils Absolute: 133 {cells}/uL (ref 15–500)
Eosinophils Relative: 2.5 %
HCT: 43.8 % (ref 35.0–45.0)
Hemoglobin: 14.5 g/dL (ref 11.7–15.5)
MCH: 32.3 pg (ref 27.0–33.0)
MCHC: 33.1 g/dL (ref 32.0–36.0)
MCV: 97.6 fL (ref 80.0–100.0)
MPV: 9.9 fL (ref 7.5–12.5)
Monocytes Relative: 10.4 %
Neutro Abs: 3360 {cells}/uL (ref 1500–7800)
Neutrophils Relative %: 63.4 %
Platelets: 280 10*3/uL (ref 140–400)
RBC: 4.49 10*6/uL (ref 3.80–5.10)
RDW: 11.8 % (ref 11.0–15.0)
Total Lymphocyte: 22.8 %
WBC: 5.3 10*3/uL (ref 3.8–10.8)

## 2023-08-03 LAB — COMPLETE METABOLIC PANEL WITH GFR
AG Ratio: 1.6 (calc) (ref 1.0–2.5)
ALT: 17 U/L (ref 6–29)
AST: 17 U/L (ref 10–35)
Albumin: 4.1 g/dL (ref 3.6–5.1)
Alkaline phosphatase (APISO): 75 U/L (ref 37–153)
BUN: 15 mg/dL (ref 7–25)
CO2: 30 mmol/L (ref 20–32)
Calcium: 9.2 mg/dL (ref 8.6–10.4)
Chloride: 101 mmol/L (ref 98–110)
Creat: 0.66 mg/dL (ref 0.50–1.05)
Globulin: 2.5 g/dL (ref 1.9–3.7)
Glucose, Bld: 93 mg/dL (ref 65–99)
Potassium: 4.4 mmol/L (ref 3.5–5.3)
Sodium: 138 mmol/L (ref 135–146)
Total Bilirubin: 0.5 mg/dL (ref 0.2–1.2)
Total Protein: 6.6 g/dL (ref 6.1–8.1)
eGFR: 97 mL/min/{1.73_m2} (ref >=60)

## 2023-08-04 NOTE — Progress Notes (Signed)
CBC and CMP are normal.

## 2023-08-16 ENCOUNTER — Ambulatory Visit: Payer: Medicare Other | Admitting: Rheumatology

## 2023-11-19 ENCOUNTER — Other Ambulatory Visit: Payer: Self-pay | Admitting: Physician Assistant

## 2023-11-19 NOTE — Telephone Encounter (Signed)
 Last Fill: 07/07/2023  Eye exam: 11/06/2022 WNL    Labs: 08/02/2023 CBC and CMP are normal.   Next Visit: 01/11/2024  Last Visit: 08/02/2023  DX: Rheumatoid arthritis involving multiple sites with positive rheumatoid factor   Current Dose per office note 08/02/2023: Plaquenil 200 mg 1 tablet by mouth twice daily Monday through Friday   Attempted to contact the patient and left message to advise patient she is due to update her PLQ eye exam.   Okay to refill Plaquenil?

## 2023-11-25 ENCOUNTER — Telehealth: Payer: Self-pay | Admitting: Rheumatology

## 2023-11-25 DIAGNOSIS — Z79899 Other long term (current) drug therapy: Secondary | ICD-10-CM

## 2023-11-25 DIAGNOSIS — M0579 Rheumatoid arthritis with rheumatoid factor of multiple sites without organ or systems involvement: Secondary | ICD-10-CM

## 2023-11-25 NOTE — Telephone Encounter (Signed)
 Patient states she needs a referral to the ophthalmologist.

## 2023-11-25 NOTE — Telephone Encounter (Signed)
 Pt called stating she needs a request on the OCT and visual field test requested by us  to her new eye doctor. Pt stated they need it requested first before she schedules her appt.  Pt now goes to [facility] and fax number is [phone]

## 2023-11-25 NOTE — Telephone Encounter (Signed)
 Ok to place referral.

## 2023-11-29 NOTE — Telephone Encounter (Signed)
 Referral placed.

## 2023-12-09 ENCOUNTER — Telehealth: Payer: Self-pay | Admitting: Rheumatology

## 2023-12-09 NOTE — Telephone Encounter (Signed)
 faxed

## 2023-12-09 NOTE — Telephone Encounter (Signed)
 Amber from [facility] called stating they did not receive any demographics on this pt and would like it faxed over. Fax is [phone]

## 2023-12-28 NOTE — Progress Notes (Signed)
 Office Visit Note  Patient: Jamie Mccall             Date of Birth: [birth year]           MRN: [id number]             PCP: Lory Rough., PA-C Referring: Lory Rough., PA-C Visit Date: 01/11/2024 Occupation: @GUAROCC @  Subjective:  Nodule on left index finger  History of Present Illness: Jamie Mccall is a 68 y.o. female with rheumatoid arthritis, osteoarthritis and degenerative disc disease.  She returns today after last visit in December 2024.  She states she has been doing well without any flares of rheumatoid arthritis.  She has not had any joint swelling.  She had good response to carpal tunnel injections given in the past.  She is bothered by the rheumatoid nodule on her left index finger and her right middle finger tip.  She has been taking Plaquenil  200 mg twice a day Monday to Friday.  She states her eye examination is coming up in June.    Activities of Daily Living:  Patient reports morning stiffness for 1 minute.   Patient Denies nocturnal pain.  Difficulty dressing/grooming: Denies Difficulty climbing stairs: Denies Difficulty getting out of chair: Denies Difficulty using hands for taps, buttons, cutlery, and/or writing: Reports  Review of Systems  Constitutional:  Negative for fatigue.  HENT:  Negative for mouth sores and mouth dryness.   Eyes:  Negative for dryness.  Respiratory:  Negative for shortness of breath.   Cardiovascular:  Negative for chest pain and palpitations.  Gastrointestinal:  Negative for blood in stool, constipation and diarrhea.  Endocrine: Negative for increased urination.  Genitourinary:  Negative for involuntary urination.  Musculoskeletal:  Positive for morning stiffness. Negative for joint pain, gait problem, joint pain, joint swelling, myalgias, muscle weakness, muscle tenderness and myalgias.  Skin:  Negative for color change, rash, hair loss and sensitivity to sunlight.  Allergic/Immunologic: Negative for susceptible  to infections.  Neurological:  Negative for dizziness and headaches.  Hematological:  Negative for swollen glands.  Psychiatric/Behavioral:  Negative for depressed mood and sleep disturbance. The patient is not nervous/anxious.     PMFS History:  Patient Active Problem List   Diagnosis Date Noted   Compression fracture of T10 vertebra (HCC) 10/05/2022   Chronic midline thoracic back pain 10/05/2022   Bilateral hand pain 04/30/2017   Mass 04/30/2017   High risk medication use 10/13/2016   Degenerative joint disease involving multiple joints 10/13/2016   History of hypertension 10/13/2016   History of depression 10/13/2016   Morning joint stiffness 10/13/2016   Glaucoma suspect, bilateral 02/16/2014   Primary open angle glaucoma (POAG) 01/18/2014   Punctal stenosis, acquired, bilateral 01/18/2014   Epiphora 01/05/2014   Pseudophakia 09/06/2013   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   Ptosis 07/31/2013   PCO (posterior capsular opacification) 03/13/2013   History of corneal transplant 08/12/2012   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HCC) 05/04/2012   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    Past Medical History:  Diagnosis Date   Arthritis    RA    Family History  Problem Relation Age of Onset   Cancer Mother    Hypertension Mother    Hypertension Father    Osteoporosis Sister    Other Sister        brain bleed   Healthy Brother    Liver disease Brother    Liver disease Brother  Past Surgical History:  Procedure Laterality Date   EXCISION MASS UPPER EXTREMETIES Left 06/15/2017   Procedure: EXCISION MASS LEFT INDEX FINGER;  Surgeon: Lyanne Sample, MD;  Location: [facility];  Service: Orthopedics;  Laterality: Left;   EYE SURGERY     rt corneal transplants   FOOT SURGERY Left 2018   cyst removed    HAND SURGERY Right    HAND SURGERY Left 2019   Dr. Huntley Mai    MASS EXCISION Left 11/05/2016   Procedure:  Excision of left foot mass;  Surgeon: Amada Backer, MD;  Location: [facility];  Service: Orthopedics;  Laterality: Left;   SQUAMOUS CELL CARCINOMA EXCISION  08/18/2018   right eye   Social History   Social History Narrative   Not on file   Immunization History  Administered Date(s) Administered   Influenza,inj,Quad PF,6+ Mos 08/18/2017, 08/18/2017     Objective: Vital Signs: BP (!) 150/78 (BP Location: Right Arm, Patient Position: Sitting, Cuff Size: Large)   Pulse 86   Resp 15   Ht 5' 4.5" (1.638 m)   Wt 206 lb 6.4 oz (93.6 kg)   BMI 34.88 kg/m    Physical Exam Vitals and nursing note reviewed.  Constitutional:      Appearance: She is well-developed.  HENT:     Head: Normocephalic and atraumatic.  Eyes:     Conjunctiva/sclera: Conjunctivae normal.  Cardiovascular:     Rate and Rhythm: Normal rate and regular rhythm.     Heart sounds: Normal heart sounds.  Pulmonary:     Effort: Pulmonary effort is normal.     Breath sounds: Normal breath sounds.  Abdominal:     General: Bowel sounds are normal.     Palpations: Abdomen is soft.  Musculoskeletal:     Cervical back: Normal range of motion.  Lymphadenopathy:     Cervical: No cervical adenopathy.  Skin:    General: Skin is warm and dry.     Capillary Refill: Capillary refill takes less than 2 seconds.  Neurological:     Mental Status: She is alert and oriented to person, place, and time.  Psychiatric:        Behavior: Behavior normal.      Musculoskeletal Exam: She had good range of motion of the cervical spine.  Thoracic kyphosis was noted.  Shoulders, elbows, wrist joints were in good range of motion.  She has synovial thickening over bilateral 2nd and 3rd MCP joints.  Rheumatoid nodule was noted on the tip of her right middle finger and over the middle phalanx of her left index finger.  Hip joints and knee joints in good range of motion.  She thickening of bilateral ankle joints without any warmth.   There was no tenderness over MTPs.  CDAI Exam: CDAI Score: -- Patient Global: --; Provider Global: -- Swollen: --; Tender: -- Joint Exam 01/11/2024   No joint exam has been documented for this visit   There is currently no information documented on the homunculus. Go to the Rheumatology activity and complete the homunculus joint exam.  Investigation: No additional findings.  Imaging: No results found.  Recent Labs: Lab Results  Component Value Date   WBC 5.3 08/02/2023   HGB 14.5 08/02/2023   PLT 280 08/02/2023   NA 138 08/02/2023   K 4.4 08/02/2023   CL 101 08/02/2023   CO2 30 08/02/2023   GLUCOSE 93 08/02/2023   BUN 15 08/02/2023   CREATININE 0.66 08/02/2023   BILITOT  0.5 08/02/2023   ALKPHOS 61 10/13/2016   AST 17 08/02/2023   ALT 17 08/02/2023   PROT 6.6 08/02/2023   ALBUMIN 3.7 10/13/2016   CALCIUM 9.2 08/02/2023   GFRAA 108 12/24/2020   QFTBGOLDPLUS NEGATIVE 12/22/2017    Speciality Comments: PLQ Eye Exam: 11/06/2022 WNL @ [facility] Follow up in 1 year  pt states she will not use Enbrel again/ causes Bronchitis,  Procedures:  Hand/UE Inj for (Rheumatoid nodule) on 01/11/2024 2:14 PM Indications: therapeutic Details: 27 G needle, volar approach Medications: 10 mg triamcinolone  acetonide 40 MG/ML Aspirate: 0 mL  Risk of infection, discoloration and dermal atrophy was discussed. Procedure, treatment alternatives, risks and benefits explained, specific risks discussed. Immediately prior to procedure a time out was called to verify the correct patient, procedure, equipment, support staff and site/side marked as required. Patient was prepped and draped in the usual sterile fashion.     Allergies: Enbrel [etanercept] and Sulfasalazine    Assessment / Plan:     Visit Diagnoses: Rheumatoid arthritis involving multiple sites with positive rheumatoid factor (HCC) - positive RF, positive anti-CCP, +14 3 3  eta.  She has severe rheumatoid arthritis  involving multiple joints.  Synovial thickening was noted over MCP joints and ankle joints.  Patient denies any morning stiffness, joint discomfort or arthritis flare.  She continues to take Plaquenil  without any interruption.  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  Patient requested injection in the left index finger nodule.  After informed consent was obtained left index finger was prepped in sterile fashion and injected with Kenalog  as described above.  Patient tolerated the procedure well.  Postprocedure instructions were given.  High risk medication use - Plaquenil  200 mg 1 tablet by mouth twice daily Monday through Friday. PLQ Eye Exam: 11/06/2022.  Labs obtained in December 2024 CBC and CMP were normal.  Will get labs today.  She states her eye examination is scheduled in June.  Carpal tunnel syndrome of left wrist - left carpal tunnel injection on 12/01/2022 and 08/02/2023.  She had a good response to injections.  Primary osteoarthritis of both hands-she has rheumatoid arthritis and osteoarthritis overlap.  Joint protection was discussed.  Pain in thoracic spine-currently not symptomatic.  Spondylosis of lumbar spine -she has intermittent discomfort in the lower back.  She is currently not having any discomfort.  She had MRI of the lumbar spine on March 20, 2022.  Patient states she had injections by Dr. Daisey Dryer and had pain relief.  History of corneal transplant - Followed by ophthalmologist at [facility].  History of hypertension-blood pressure was elevated today.  She was advised to monitor blood pressure closely and follow-up with her PCP.  Vitamin D  deficiency-vitamin D  was 56.  Will recheck vitamin D  level today.  She has been taking vitamin D  on a regular basis.  Dyslipidemia-I will check lipid panel today.  History of depression  Other fatigue  Postmenopausal  Osteopenia of multiple sites - DEXA scan from January 12, 2023 showed T-score of -2.0 AP spine.  Use of calcium rich diet, vitamin D  and exercises was emphasized.  She will have repeat DEXA scan next year.  Orders: Orders Placed This Encounter  Procedures   Hand/UE Inj   CBC with Differential/Platelet   Comprehensive metabolic panel with GFR   VITAMIN D  25 Hydroxy (Vit-D Deficiency, Fractures)   Lipid panel   No orders of the defined types were placed in this encounter.  Face-to-face time spent with patient was over 30 minutes. Greater than 50% of time was spent in counseling and coordination of care.  Follow-Up Instructions: Return in about 5 months (around 06/12/2024) for Rheumatoid arthritis.   Nicholas Bari, MD  Note - This record has been created using Animal nutritionist.  Chart creation errors have been sought, but may not always  have been located. Such creation errors do not reflect on  the standard of medical care.

## 2024-01-11 ENCOUNTER — Ambulatory Visit: Payer: Medicare Other | Attending: Rheumatology | Admitting: Rheumatology

## 2024-01-11 ENCOUNTER — Encounter: Payer: Self-pay | Admitting: Rheumatology

## 2024-01-11 VITALS — BP 150/78 | HR 86 | Resp 15 | Ht 64.5 in | Wt 206.4 lb

## 2024-01-11 DIAGNOSIS — Z780 Asymptomatic menopausal state: Secondary | ICD-10-CM | POA: Insufficient documentation

## 2024-01-11 DIAGNOSIS — R5383 Other fatigue: Secondary | ICD-10-CM | POA: Diagnosis present

## 2024-01-11 DIAGNOSIS — Z8659 Personal history of other mental and behavioral disorders: Secondary | ICD-10-CM | POA: Diagnosis present

## 2024-01-11 DIAGNOSIS — M47816 Spondylosis without myelopathy or radiculopathy, lumbar region: Secondary | ICD-10-CM | POA: Diagnosis present

## 2024-01-11 DIAGNOSIS — M19041 Primary osteoarthritis, right hand: Secondary | ICD-10-CM | POA: Insufficient documentation

## 2024-01-11 DIAGNOSIS — Z79899 Other long term (current) drug therapy: Secondary | ICD-10-CM | POA: Diagnosis present

## 2024-01-11 DIAGNOSIS — M19042 Primary osteoarthritis, left hand: Secondary | ICD-10-CM | POA: Insufficient documentation

## 2024-01-11 DIAGNOSIS — E785 Hyperlipidemia, unspecified: Secondary | ICD-10-CM | POA: Insufficient documentation

## 2024-01-11 DIAGNOSIS — M0630 Rheumatoid nodule, unspecified site: Secondary | ICD-10-CM | POA: Insufficient documentation

## 2024-01-11 DIAGNOSIS — E559 Vitamin D deficiency, unspecified: Secondary | ICD-10-CM | POA: Diagnosis present

## 2024-01-11 DIAGNOSIS — Z947 Corneal transplant status: Secondary | ICD-10-CM | POA: Insufficient documentation

## 2024-01-11 DIAGNOSIS — M8589 Other specified disorders of bone density and structure, multiple sites: Secondary | ICD-10-CM | POA: Insufficient documentation

## 2024-01-11 DIAGNOSIS — G5602 Carpal tunnel syndrome, left upper limb: Secondary | ICD-10-CM | POA: Diagnosis present

## 2024-01-11 DIAGNOSIS — Z8679 Personal history of other diseases of the circulatory system: Secondary | ICD-10-CM | POA: Diagnosis present

## 2024-01-11 DIAGNOSIS — M0579 Rheumatoid arthritis with rheumatoid factor of multiple sites without organ or systems involvement: Secondary | ICD-10-CM | POA: Diagnosis present

## 2024-01-11 DIAGNOSIS — M546 Pain in thoracic spine: Secondary | ICD-10-CM | POA: Insufficient documentation

## 2024-01-11 LAB — CBC WITH DIFFERENTIAL/PLATELET
Absolute Lymphocytes: 1231 {cells}/uL (ref 850–3900)
Absolute Monocytes: 529 {cells}/uL (ref 200–950)
Basophils Absolute: 59 {cells}/uL (ref 0–200)
Basophils Relative: 1.1 %
Eosinophils Absolute: 162 {cells}/uL (ref 15–500)
Eosinophils Relative: 3 %
HCT: 43.7 % (ref 35.0–45.0)
Hemoglobin: 14.4 g/dL (ref 11.7–15.5)
MCH: 32.9 pg (ref 27.0–33.0)
MCHC: 33 g/dL (ref 32.0–36.0)
MCV: 99.8 fL (ref 80.0–100.0)
MPV: 9.5 fL (ref 7.5–12.5)
Monocytes Relative: 9.8 %
Neutro Abs: 3418 {cells}/uL (ref 1500–7800)
Neutrophils Relative %: 63.3 %
Platelets: 251 10*3/uL (ref 140–400)
RBC: 4.38 10*6/uL (ref 3.80–5.10)
RDW: 12 % (ref 11.0–15.0)
Total Lymphocyte: 22.8 %
WBC: 5.4 10*3/uL (ref 3.8–10.8)

## 2024-01-11 LAB — LIPID PANEL
Cholesterol: 222 mg/dL — ABNORMAL HIGH (ref <200)
HDL: 97 mg/dL (ref >=50)
LDL Cholesterol (Calc): 110 mg/dL — ABNORMAL HIGH
Non-HDL Cholesterol (Calc): 125 mg/dL (ref <130)
Total CHOL/HDL Ratio: 2.3 (calc) (ref <5.0)
Triglycerides: 63 mg/dL (ref <150)

## 2024-01-11 LAB — COMPREHENSIVE METABOLIC PANEL WITH GFR
AG Ratio: 1.5 (calc) (ref 1.0–2.5)
ALT: 23 U/L (ref 6–29)
AST: 20 U/L (ref 10–35)
Albumin: 4 g/dL (ref 3.6–5.1)
Alkaline phosphatase (APISO): 72 U/L (ref 37–153)
BUN: 16 mg/dL (ref 7–25)
CO2: 32 mmol/L (ref 20–32)
Calcium: 9.5 mg/dL (ref 8.6–10.4)
Chloride: 100 mmol/L (ref 98–110)
Creat: 0.81 mg/dL (ref 0.50–1.05)
Globulin: 2.7 g/dL (ref 1.9–3.7)
Glucose, Bld: 100 mg/dL — ABNORMAL HIGH (ref 65–99)
Potassium: 4.5 mmol/L (ref 3.5–5.3)
Sodium: 137 mmol/L (ref 135–146)
Total Bilirubin: 0.5 mg/dL (ref 0.2–1.2)
Total Protein: 6.7 g/dL (ref 6.1–8.1)
eGFR: 81 mL/min/{1.73_m2} (ref >=60)

## 2024-01-11 LAB — VITAMIN D 25 HYDROXY (VIT D DEFICIENCY, FRACTURES): Vit D, 25-Hydroxy: 116 ng/mL — ABNORMAL HIGH (ref 30–100)

## 2024-01-11 MED ORDER — TRIAMCINOLONE ACETONIDE 40 MG/ML IJ SUSP
10.0000 mg | INTRAMUSCULAR | Status: AC | PRN
Start: 2024-01-11 — End: 2024-01-11
  Administered 2024-01-11: 10 mg

## 2024-01-11 NOTE — Addendum Note (Signed)
 Addended by: Nicholle Falzon on: 01/11/2024 02:20 PM   Modules accepted: Level of Service

## 2024-01-11 NOTE — Patient Instructions (Addendum)
 Vaccines You are taking a medication(s) that can suppress your immune system.  The following immunizations are recommended: Flu annually Covid-19  Td/Tdap (tetanus, diphtheria, pertussis) every 10 years Pneumonia (Prevnar 15 then Pneumovax 23 at least 1 year apart.  Alternatively, can take Prevnar 20 without needing additional dose) Shingrix: 2 doses from 4 weeks to 6 months apart  Please check with your PCP to make sure you are up to date.

## 2024-01-12 ENCOUNTER — Ambulatory Visit: Payer: Self-pay | Admitting: Rheumatology

## 2024-01-12 DIAGNOSIS — M8589 Other specified disorders of bone density and structure, multiple sites: Secondary | ICD-10-CM

## 2024-01-12 DIAGNOSIS — R899 Unspecified abnormal finding in specimens from other organs, systems and tissues: Secondary | ICD-10-CM

## 2024-01-12 NOTE — Progress Notes (Signed)
 CBC and CMP are normal.  LDL is elevated at 110.  Vitamin D  is high and toxic levels.  She should stop vitamin D  and recheck vitamin D  level in 3 months.  Vitamin D  dose should be reduced in the future to at least half of the current dose.

## 2024-01-12 NOTE — Telephone Encounter (Signed)
-----   Message from [facility] sent at 01/12/2024  7:58 AM EDT ----- CBC and CMP are normal.  LDL is elevated at 110.  Vitamin D  is high and toxic levels.  She should stop vitamin D  and recheck vitamin D  level in 3 months.  Vitamin D  dose should be reduced in the future to at least half of the current dose.

## 2024-02-15 ENCOUNTER — Other Ambulatory Visit: Payer: Self-pay | Admitting: Rheumatology

## 2024-02-15 NOTE — Telephone Encounter (Signed)
 Last Fill: 11/19/2023  Eye exam: 11/06/2022 WNL   Labs: 01/11/2024 CBC and CMP are normal.  LDL is elevated at 110.  Vitamin D  is high and toxic levels.  She should stop vitamin D  and recheck vitamin D  level in 3 months.  Vitamin D  dose should be reduced in the future to at least half of the current dose.   Next Visit: 07/18/2024  Last Visit: 01/11/2024  IK:Myzlfjunpi arthritis involving multiple sites with positive rheumatoid factor (HCC)   Current Dose per office note 01/11/2024: Plaquenil  200 mg 1 tablet by mouth twice daily Monday through Friday.   Attempted to contact the patient and left a message to call the office back regarding her PLQ eye exam.   Okay to refill Plaquenil ?

## 2024-04-06 ENCOUNTER — Other Ambulatory Visit: Payer: Self-pay | Admitting: Rheumatology

## 2024-04-06 NOTE — Telephone Encounter (Signed)
 Last Fill: 02/15/2024 (30 day supply)  Eye exam: 02/03/2024 WNL   Labs: 01/11/2024 CBC and CMP are normal.     Next Visit: 07/14/2024  Last Visit: 01/11/2024  IK:Myzlfjunpi arthritis involving multiple sites with positive rheumatoid factor   Current Dose per office note 01/11/2024: Plaquenil  200 mg 1 tablet by mouth twice daily Monday through Friday   Okay to refill Plaquenil ?

## 2024-06-12 NOTE — Progress Notes (Unsigned)
 Office Visit Note  Patient: Jamie Mccall             Date of Birth: 12-27-1955           MRN: [id number]             PCP: Debrah Josette ORN., PA-C Referring: Debrah Josette ORN., PA-C Visit Date: 06/13/2024 Occupation: Data Unavailable  Subjective:  No chief complaint on file.   History of Present Illness: Jamie Mccall is a 68 y.o. female ***     Activities of Daily Living:  Patient reports morning stiffness for *** {minute/hour:19697}.   Patient {ACTIONS;DENIES/REPORTS:21021675::Denies} nocturnal pain.  Difficulty dressing/grooming: {ACTIONS;DENIES/REPORTS:21021675::Denies} Difficulty climbing stairs: {ACTIONS;DENIES/REPORTS:21021675::Denies} Difficulty getting out of chair: {ACTIONS;DENIES/REPORTS:21021675::Denies} Difficulty using hands for taps, buttons, cutlery, and/or writing: {ACTIONS;DENIES/REPORTS:21021675::Denies}  No Rheumatology ROS completed.   PMFS History:  Patient Active Problem List   Diagnosis Date Noted   Compression fracture of T10 vertebra (HCC) 10/05/2022   Chronic midline thoracic back pain 10/05/2022   Bilateral hand pain 04/30/2017   Mass 04/30/2017   High risk medication use 10/13/2016   Degenerative joint disease involving multiple joints 10/13/2016   History of hypertension 10/13/2016   History of depression 10/13/2016   Morning joint stiffness 10/13/2016   Glaucoma suspect, bilateral 02/16/2014   Primary open angle glaucoma (POAG) 01/18/2014   Punctal stenosis, acquired, bilateral 01/18/2014   Epiphora 01/05/2014   Pseudophakia 09/06/2013   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   Ptosis 07/31/2013   PCO (posterior capsular opacification) 03/13/2013   History of corneal transplant 08/12/2012   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HCC) 05/04/2012   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    Past Medical History:  Diagnosis Date   Arthritis    RA    Family History  Problem  Relation Age of Onset   Cancer Mother    Hypertension Mother    Hypertension Father    Osteoporosis Sister    Other Sister        brain bleed   Healthy Brother    Liver disease Brother    Liver disease Brother    Past Surgical History:  Procedure Laterality Date   EXCISION MASS UPPER EXTREMETIES Left 06/15/2017   Procedure: EXCISION MASS LEFT INDEX FINGER;  Surgeon: Murrell Kuba, MD;  Location: [facility];  Service: Orthopedics;  Laterality: Left;   EYE SURGERY     rt corneal transplants   FOOT SURGERY Left 2018   cyst removed    HAND SURGERY Right    HAND SURGERY Left 2019   Dr. Murrell    MASS EXCISION Left 11/05/2016   Procedure: Excision of left foot mass;  Surgeon: Norleen Armor, MD;  Location: [facility];  Service: Orthopedics;  Laterality: Left;   SQUAMOUS CELL CARCINOMA EXCISION  08/18/2018   right eye   Social History   Tobacco Use   Smoking status: Every Day    Current packs/day: 0.00    Average packs/day: 1 pack/day for 15.0 years (15.0 ttl pk-yrs)    Types: Cigarettes    Start date: 04/14/2007    Last attempt to quit: 04/13/2022    Years since quitting: 2.1    Passive exposure: Never   Smokeless tobacco: Never  Vaping Use   Vaping status: Never Used  Substance Use Topics   Alcohol use: Yes    Comment: wine daily   Drug use: Never   Social History   Social History Narrative   Not on  file     Immunization History  Administered Date(s) Administered   Influenza,inj,Quad PF,6+ Mos 08/18/2017, 08/18/2017     Objective: Vital Signs: There were no vitals taken for this visit.   Physical Exam   Musculoskeletal Exam: ***  CDAI Exam: CDAI Score: -- Patient Global: --; Provider Global: -- Swollen: --; Tender: -- Joint Exam 06/13/2024   No joint exam has been documented for this visit   There is currently no information documented on the homunculus. Go to the Rheumatology activity and complete the homunculus joint  exam.  Investigation: No additional findings.  Imaging: No results found.  Recent Labs: Lab Results  Component Value Date   WBC 5.4 01/11/2024   HGB 14.4 01/11/2024   PLT 251 01/11/2024   NA 137 01/11/2024   K 4.5 01/11/2024   CL 100 01/11/2024   CO2 32 01/11/2024   GLUCOSE 100 (H) 01/11/2024   BUN 16 01/11/2024   CREATININE 0.81 01/11/2024   BILITOT 0.5 01/11/2024   ALKPHOS 61 10/13/2016   AST 20 01/11/2024   ALT 23 01/11/2024   PROT 6.7 01/11/2024   ALBUMIN 3.7 10/13/2016   CALCIUM 9.5 01/11/2024   GFRAA 108 12/24/2020   QFTBGOLDPLUS NEGATIVE 12/22/2017    Speciality Comments: PLQ Eye Exam: 02/03/2024 WNL @ [facility]  Follow up in 1 year  pt states she will not use Enbrel again/ causes Bronchitis,  Procedures:  No procedures performed Allergies: Enbrel [etanercept] and Sulfasalazine    Assessment / Plan:     Visit Diagnoses: No diagnosis found.  Orders: No orders of the defined types were placed in this encounter.  No orders of the defined types were placed in this encounter.   Face-to-face time spent with patient was *** minutes. Greater than 50% of time was spent in counseling and coordination of care.  Follow-Up Instructions: No follow-ups on file.   Daved JAYSON Gavel, CMA  Note - This record has been created using Animal nutritionist.  Chart creation errors have been sought, but may not always  have been located. Such creation errors do not reflect on  the standard of medical care.

## 2024-06-13 ENCOUNTER — Encounter: Payer: Self-pay | Admitting: Rheumatology

## 2024-06-13 ENCOUNTER — Ambulatory Visit: Attending: Rheumatology | Admitting: Rheumatology

## 2024-06-13 ENCOUNTER — Other Ambulatory Visit (HOSPITAL_COMMUNITY): Payer: Self-pay

## 2024-06-13 VITALS — BP 177/71 | HR 75 | Temp 97.9°F | Resp 15 | Ht 64.0 in | Wt 213.8 lb

## 2024-06-13 DIAGNOSIS — E559 Vitamin D deficiency, unspecified: Secondary | ICD-10-CM | POA: Insufficient documentation

## 2024-06-13 DIAGNOSIS — Z8781 Personal history of (healed) traumatic fracture: Secondary | ICD-10-CM | POA: Diagnosis present

## 2024-06-13 DIAGNOSIS — M810 Age-related osteoporosis without current pathological fracture: Secondary | ICD-10-CM | POA: Insufficient documentation

## 2024-06-13 DIAGNOSIS — R5383 Other fatigue: Secondary | ICD-10-CM | POA: Diagnosis present

## 2024-06-13 DIAGNOSIS — Z8679 Personal history of other diseases of the circulatory system: Secondary | ICD-10-CM | POA: Diagnosis present

## 2024-06-13 DIAGNOSIS — Z79899 Other long term (current) drug therapy: Secondary | ICD-10-CM | POA: Insufficient documentation

## 2024-06-13 DIAGNOSIS — M0630 Rheumatoid nodule, unspecified site: Secondary | ICD-10-CM | POA: Insufficient documentation

## 2024-06-13 DIAGNOSIS — Z8262 Family history of osteoporosis: Secondary | ICD-10-CM | POA: Insufficient documentation

## 2024-06-13 DIAGNOSIS — M7021 Olecranon bursitis, right elbow: Secondary | ICD-10-CM | POA: Insufficient documentation

## 2024-06-13 DIAGNOSIS — M19042 Primary osteoarthritis, left hand: Secondary | ICD-10-CM | POA: Insufficient documentation

## 2024-06-13 DIAGNOSIS — E785 Hyperlipidemia, unspecified: Secondary | ICD-10-CM | POA: Insufficient documentation

## 2024-06-13 DIAGNOSIS — M47816 Spondylosis without myelopathy or radiculopathy, lumbar region: Secondary | ICD-10-CM | POA: Diagnosis present

## 2024-06-13 DIAGNOSIS — Z8659 Personal history of other mental and behavioral disorders: Secondary | ICD-10-CM | POA: Insufficient documentation

## 2024-06-13 DIAGNOSIS — M546 Pain in thoracic spine: Secondary | ICD-10-CM | POA: Diagnosis present

## 2024-06-13 DIAGNOSIS — Z947 Corneal transplant status: Secondary | ICD-10-CM | POA: Insufficient documentation

## 2024-06-13 DIAGNOSIS — M19041 Primary osteoarthritis, right hand: Secondary | ICD-10-CM | POA: Diagnosis present

## 2024-06-13 DIAGNOSIS — G5602 Carpal tunnel syndrome, left upper limb: Secondary | ICD-10-CM | POA: Diagnosis present

## 2024-06-13 DIAGNOSIS — M8589 Other specified disorders of bone density and structure, multiple sites: Secondary | ICD-10-CM

## 2024-06-13 DIAGNOSIS — M0579 Rheumatoid arthritis with rheumatoid factor of multiple sites without organ or systems involvement: Secondary | ICD-10-CM | POA: Insufficient documentation

## 2024-06-13 DIAGNOSIS — Z780 Asymptomatic menopausal state: Secondary | ICD-10-CM

## 2024-06-13 LAB — SYNOVIAL FLUID ANALYSIS, COMPLETE
Basophils, %: 0 %
Eosinophils-Synovial: 0 % (ref 0–2)
Lymphocytes-Synovial Fld: 18 % (ref 0–74)
Monocyte/Macrophage: 14 % (ref 0–69)
Neutrophil, Synovial: 68 % — ABNORMAL HIGH (ref 0–24)
Synoviocytes, %: 0 % (ref 0–15)
WBC, Synovial: 434 {cells}/uL — ABNORMAL HIGH (ref <150)

## 2024-06-13 MED ORDER — LIDOCAINE HCL 1 % IJ SOLN
3.0000 mL | INTRAMUSCULAR | Status: AC | PRN
  Administered 2024-06-13: 3 mL

## 2024-06-13 NOTE — Patient Instructions (Addendum)
 Abaloparatide Injection What is this medication? ABALOPARATIDE (a ball oh PAR a tide) treats osteoporosis. It works by interior and spatial designer stronger and less likely to break (fracture). This medicine may be used for other purposes; ask your health care provider or pharmacist if you have questions. COMMON BRAND NAME(S): TYMLOS What should I tell my care team before I take this medication? They need to know if you have any of these conditions: Bone disease other than osteoporosis High levels of an enzyme called alkaline phosphatase in the blood High levels of calcium in the blood History of cancer in the bone Kidney stone Paget's disease Parathyroid disease Receiving radiation therapy An unusual or allergic reaction to abaloparatide, other medications, foods, dyes, or preservatives Pregnant or trying to get pregnant Breast-feeding How should I use this medication? This medication is injected under the skin. You will be taught how to prepare and give it. Take it as directed on the prescription label at the same time every day. Keep taking it unless your care team tells you to stop. It is important that you put your used needles and pens in a special sharps container. Do not put them in a trash can. If you do not have a sharps container, call your pharmacist or care team to get one. A special MedGuide will be given to you by the pharmacist with each prescription and refill. Be sure to read this information carefully each time. Talk to your care team about the use of this medication in children. Special care may be needed. Overdosage: If you think you have taken too much of this medicine contact a poison control center or emergency room at once. NOTE: This medicine is only for you. Do not share this medicine with others. What if I miss a dose? If you miss a dose, take it as soon as you can. If it is almost time for your next dose, take only that dose. Do not take double or extra doses. What may  interact with this medication? Interactions have not been studied. This list may not describe all possible interactions. Give your health care provider a list of all the medicines, herbs, non-prescription drugs, or dietary supplements you use. Also tell them if you smoke, drink alcohol, or use illegal drugs. Some items may interact with your medicine. What should I watch for while using this medication? Visit your care team for regular checks on your progress. You may need blood work done while you are taking this medication. This medication may affect your coordination, reaction time, or judgment. Do not drive or operate machinery until you know how this medication affects you. Sit up or stand slowly to reduce the risk of dizzy or fainting spells. Drinking alcohol with this medication can increase the risk of these side effects. You should make sure that you get enough calcium and vitamin D  while you are taking this medication. Discuss the foods you eat and the vitamins you take with your care team. Talk to your care team about your risk of cancer. You may be more at risk for certain types of cancers if you take this medication. Do not share pens with anyone, even if the needle is changed. Each pen should only be used by one person. Sharing could cause an infection. What side effects may I notice from receiving this medication? Side effects that you should report to your care team as soon as possible: Allergic reactions--skin rash, itching, hives, swelling of the face, lips, tongue, or throat High  calcium level--increased thirst or amount of urine, nausea, vomiting, confusion, unusual weakness or fatigue, bone pain Kidney stones--blood in the urine, pain or trouble passing urine, pain in the lower back or sides Low blood pressure--dizziness, feeling faint or lightheaded, blurry vision Side effects that usually do not require medical attention (report these to your care team if they continue or are  bothersome): Dizziness Fatigue Headache Nausea Pain, redness, or irritation at injection site This list may not describe all possible side effects. Call your doctor for medical advice about side effects. You may report side effects to FDA at 1-800-FDA-1088. Where should I keep my medication? Keep out of the reach of children and pets. Store unopened pens in the refrigerator between 2 and 8 degrees C (36 and 46 degrees F). Do not freeze. Avoid exposure to heat. Get rid of any unopened medication after the expiration date. After the first use, store your pen for up to 30 days at room temperature between 20 and 25 degrees C (68 and 77 degrees F). Do not store with a needle attached to the pen. Use the pen for only 30 days. Get rid of your pen 30 days after first opening it even if the pen still contains medication. To get rid of medications that are no longer needed or have expired: Take the medication to a medication take-back program. Check with your pharmacy or law enforcement to find a location. If you cannot return the medication, ask your pharmacist or care team how to get rid of this medication safely. NOTE: This sheet is a summary. It may not cover all possible information. If you have questions about this medicine, talk to your doctor, pharmacist, or health care provider.  2024 Elsevier/Gold Standard (2022-10-23 00:00:00)  Standing Labs We placed an order today for your standing lab work.   Please have your standing labs drawn in February  Please have your labs drawn 2 weeks prior to your appointment so that the provider can discuss your lab results at your appointment, if possible.  Please note that you may see your imaging and lab results in MyChart before we have reviewed them. We will contact you once all results are reviewed. Please allow our office up to 72 hours to thoroughly review all of the results before contacting the office for clarification of your results.  WALK-IN  LAB HOURS  Monday through Thursday from 8:00 am -12:30 pm and 1:00 pm-4:30 pm and Friday from 8:00 am-12:00 pm.  Patients with office visits requiring labs will be seen before walk-in labs.  You may encounter longer than normal wait times. Please allow additional time. Wait times may be shorter on  Monday and Thursday afternoons.  We do not book appointments for walk-in labs. We appreciate your patience and understanding with our staff.   Labs are drawn by [facility]. Please bring your co-pay at the time of your lab draw.  You may receive a bill from [facility] for your lab work.  Please note if you are on Hydroxychloroquine  and and an order has been placed for a Hydroxychloroquine  level,  you will need to have it drawn 4 hours or more after your last dose.  If you wish to have your labs drawn at another location, please call the office 24 hours in advance so we can fax the orders.  The office is located at [address], [address], [place], [place] [address]   If you have any questions regarding directions or hours of operation,  please call  [phone].   As a reminder, please drink plenty of water prior to coming for your lab work. Thanks!   Vaccines You are taking a medication(s) that can suppress your immune system.  The following immunizations are recommended: Flu annually Covid-19  Td/Tdap (tetanus, diphtheria, pertussis) every 10 years Pneumonia (Prevnar 15 then Pneumovax 23 at least 1 year apart.  Alternatively, can take Prevnar 20 without needing additional dose) Shingrix: 2 doses from 4 weeks to 6 months apart  Please check with your PCP to make sure you are up to date.  If you have signs or symptoms of an infection or start antibiotics: First, call your PCP for workup of your infection. Hold your medication through the infection, until you complete your antibiotics, and until symptoms resolve if you take the following: Injectable medication (Actemra, Benlysta, Cimzia, Cosentyx,  Enbrel, Humira, Kevzara, Orencia, Remicade, Simponi, Stelara, Taltz, Tremfya) Methotrexate  Leflunomide (Arava) Mycophenolate (Cellcept) Earma, Olumiant, or Rinvoq

## 2024-06-13 NOTE — Progress Notes (Signed)
 Counseled patient on purpose, proper use, storage, and adverse effects of Tymlos. Discussed the Tymlos is PTH peptide agonist which results in stimulation of osteoblast and bone mass. Reviewed that Tymlos treatment course is for 2 years. Discussed that pen is stable for 30 days after first use and at room temperature. Counseled patient that Tymlos is a medication that must be injected once daily and that prescription for pen needles will be sent with Tymlos prescription.  Advised patient to continue taking calcium 1200 mg daily and vitamin D  supplement.  Reviewed the most common adverse effects of Tymlos including orthostatic hypotension, hypercalciuria, antibody development (which has no clinical significance), GI upset, injection site reaction, and joint pain/arthralgia.  Discussed injection site reaction management and injection site locations. Discussed alternating injection site.  Discussed management of dizziness (which can occur for up to 4 hours after injection) including adequate hydration, slowly rising from bed/chairs to prevent falls, and taking Tymlos at night if needed.      Tymlos prescription pending baseline labs and pharmacy benefits investigation.  Powell Gallus, PharmD, MPH Pharmacy Resident

## 2024-06-13 NOTE — Addendum Note (Signed)
 Addended by: Janace Decker on: 06/13/2024 03:56 PM   Modules accepted: Orders

## 2024-06-14 ENCOUNTER — Ambulatory Visit: Payer: Self-pay | Admitting: Rheumatology

## 2024-06-14 ENCOUNTER — Other Ambulatory Visit (HOSPITAL_COMMUNITY): Payer: Self-pay

## 2024-06-14 ENCOUNTER — Telehealth: Payer: Self-pay

## 2024-06-14 DIAGNOSIS — M810 Age-related osteoporosis without current pathological fracture: Secondary | ICD-10-CM

## 2024-06-14 DIAGNOSIS — Z79899 Other long term (current) drug therapy: Secondary | ICD-10-CM

## 2024-06-14 NOTE — Telephone Encounter (Signed)
 Evenity is a good option if we can locate an infusion center close to her home. She states that she lived 2 hrs away from [place] so ideally do not want her to have to travel to [place] for [employer].

## 2024-06-14 NOTE — Telephone Encounter (Signed)
 Received notification from [facility] regarding a prior authorization for TERIPARATIDE. Authorization has been APPROVED from 06/14/2024 to 06/04/2026. Approval letter sent to scan center.  Per test claim, copay for 30 days supply is $379.02  Patient can fill through [facility]: [phone]   Authorization # [id number]  Called pt and discussed, she is not immediately agreeable with the copay and would like to explore other options. I explained to her the possibility of enrolling into the MPPP at the beginning of the year (which would be around $175 per month), however she also does not seem keen on that option either.   Pt mentioned that she DOES have a supplemental insurance and would be agreeable to receiving monthly injections of the Evenity in-office, however she currently lives in [place] and we would therefore need to investigate the closest facilities to her that would be available to accommodate.

## 2024-06-14 NOTE — Telephone Encounter (Signed)
 Jamie Mccall HERO, RPH  P Rx Rheum/Pulm; [name] PARAS, CPhT Tymlos BIV. Plan to start Tymlos ASAP pending labs and training. Patient plans to continue with same insurance for 2026. --------------------------------------------------------------------------------------------------------------------  While attempting to submit a Prior Authorization request for [facility], it became apparent that the preferred formulary alternative is generic Teriparatide. Submitted a Prior Authorization request to [facility] for TERIPARATIDE via CoverMyMeds. Will update once we receive a response.  Key: [id number]

## 2024-06-14 NOTE — Progress Notes (Signed)
 Synovial fluid is blood-tinged, crystals negative, sodium low and chloride low, CBC normal, vitamin D  normal at 61.  Phosphorus normal, TSH normal, TB Gold, SPEP and PTH pending

## 2024-06-14 NOTE — Telephone Encounter (Signed)
 Pt did state that she would be agreeable traveling up to 40 minutes away if there is any facilities within that range.

## 2024-06-15 NOTE — Telephone Encounter (Signed)
 There is a [facility] in [place], [place] that appears to possibly 30-35 minutes away. ATC patient to discuss. Unable to reach. Left VM requesting return call  Referral form printed.  Sherry Pennant, PharmD, MPH, BCPS, CPP Clinical Pharmacist [facility])

## 2024-06-16 NOTE — Telephone Encounter (Signed)
 Patient returned call - discussed Evenity coverage with her supplemental insurance and the possible site of care [facility] in [place], [place]). After she has met her Part B deductible, the Evenity will be fully covered.  She was interested to knowif she could find a new insurance that would fully cover Forteo but advised that only Medicare/Medicaid patients receive Forteo at affordable price as this falls into Tier 5 for all Medicare patients which can be pricey.  Reviewed OOP max of $2100 for 2026.  She would like to review further. Information sent via MyChart. She will notify our office if she wants to move forward with treatment. Closing encounter  Sherry Pennant, PharmD, MPH, BCPS, CPP Clinical Pharmacist [facility])

## 2024-06-17 LAB — COMPREHENSIVE METABOLIC PANEL WITH GFR
AG Ratio: 1.7 (calc) (ref 1.0–2.5)
ALT: 17 U/L (ref 6–29)
AST: 19 U/L (ref 10–35)
Albumin: 4 g/dL (ref 3.6–5.1)
Alkaline phosphatase (APISO): 69 U/L (ref 37–153)
BUN: 11 mg/dL (ref 7–25)
CO2: 29 mmol/L (ref 20–32)
Calcium: 9.4 mg/dL (ref 8.6–10.4)
Chloride: 95 mmol/L — ABNORMAL LOW (ref 98–110)
Creat: 0.58 mg/dL (ref 0.50–1.05)
Globulin: 2.4 g/dL (ref 1.9–3.7)
Glucose, Bld: 81 mg/dL (ref 65–99)
Potassium: 4.6 mmol/L (ref 3.5–5.3)
Sodium: 133 mmol/L — ABNORMAL LOW (ref 135–146)
Total Bilirubin: 0.5 mg/dL (ref 0.2–1.2)
Total Protein: 6.4 g/dL (ref 6.1–8.1)
eGFR: 100 mL/min/1.73m2 (ref >=60)

## 2024-06-17 LAB — CBC WITH DIFFERENTIAL/PLATELET
Absolute Lymphocytes: 1345 {cells}/uL (ref 850–3900)
Absolute Monocytes: 533 {cells}/uL (ref 200–950)
Basophils Absolute: 68 {cells}/uL (ref 0–200)
Basophils Relative: 1.1 %
Eosinophils Absolute: 174 {cells}/uL (ref 15–500)
Eosinophils Relative: 2.8 %
HCT: 41.5 % (ref 35.0–45.0)
Hemoglobin: 13.9 g/dL (ref 11.7–15.5)
MCH: 32.3 pg (ref 27.0–33.0)
MCHC: 33.5 g/dL (ref 32.0–36.0)
MCV: 96.3 fL (ref 80.0–100.0)
MPV: 9.6 fL (ref 7.5–12.5)
Monocytes Relative: 8.6 %
Neutro Abs: 4080 {cells}/uL (ref 1500–7800)
Neutrophils Relative %: 65.8 %
Platelets: 319 Thousand/uL (ref 140–400)
RBC: 4.31 Million/uL (ref 3.80–5.10)
RDW: 11.8 % (ref 11.0–15.0)
Total Lymphocyte: 21.7 %
WBC: 6.2 Thousand/uL (ref 3.8–10.8)

## 2024-06-17 LAB — PROTEIN ELECTROPHORESIS, SERUM, WITH REFLEX
Albumin ELP: 3.9 g/dL (ref 3.8–4.8)
Alpha 1: 0.3 g/dL (ref 0.2–0.3)
Alpha 2: 0.6 g/dL (ref 0.5–0.9)
Beta 2: 0.4 g/dL (ref 0.2–0.5)
Beta Globulin: 0.5 g/dL (ref 0.4–0.6)
Gamma Globulin: 0.6 g/dL — ABNORMAL LOW (ref 0.8–1.7)
Total Protein: 6.3 g/dL (ref 6.1–8.1)

## 2024-06-17 LAB — QUANTIFERON-TB GOLD PLUS
Mitogen-NIL: 3.88 [IU]/mL
NIL: 0.04 [IU]/mL
QuantiFERON-TB Gold Plus: NEGATIVE
TB1-NIL: <0 [IU]/mL
TB2-NIL: <0 [IU]/mL

## 2024-06-17 LAB — TSH: TSH: 2.61 m[IU]/L (ref 0.40–4.50)

## 2024-06-17 LAB — IFE INTERPRETATION

## 2024-06-17 LAB — PARATHYROID HORMONE, INTACT (NO CA): PTH: 28 pg/mL (ref 16–77)

## 2024-06-17 LAB — VITAMIN D 25 HYDROXY (VIT D DEFICIENCY, FRACTURES): Vit D, 25-Hydroxy: 61 ng/mL (ref 30–100)

## 2024-06-17 LAB — PHOSPHORUS: Phosphorus: 3.9 mg/dL (ref 2.1–4.3)

## 2024-06-18 ENCOUNTER — Telehealth: Payer: Self-pay | Admitting: Rheumatology

## 2024-06-18 NOTE — Telephone Encounter (Signed)
 I received a phone call from the patient.  She wanted to know the synovial fluid results and if there is an infection.  Lab results were discussed with the patient.  I also discussed that the synovial white cell count of 434 does not indicate an infection.  Patient voiced understanding. Maya Nash, MD

## 2024-06-18 NOTE — Progress Notes (Signed)
 IFE normal, PTH normal.

## 2024-06-21 ENCOUNTER — Telehealth: Payer: Self-pay | Admitting: *Deleted

## 2024-06-21 MED ORDER — INSULIN PEN NEEDLE 31G X 5 MM MISC
2 refills | Status: AC
Start: 2024-06-21 — End: ?

## 2024-06-21 MED ORDER — TERIPARATIDE 560 MCG/2.24ML ~~LOC~~ SOPN: 20.0000 ug | PEN_INJECTOR | Freq: Every evening | SUBCUTANEOUS | 4 refills | Status: AC

## 2024-06-21 NOTE — Telephone Encounter (Signed)
 [facility] fill [facility]: [phone]  Documented in separate telephone enconuter  Sherry Pennant, PharmD, MPH, BCPS, CPP Clinical Pharmacist [facility])

## 2024-06-21 NOTE — Telephone Encounter (Signed)
 Returned patient's call regarding Forteo. Discussed that [facility] be able to fill medication and delivery it to her house. She is aware of the cost and willing to pay. 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 [facility]. Patient will be expecting to hear from [facility] to set up payment and shipping information.  Jenkins Graces, PharmD PGY1 Pharmacy Resident [phone]

## 2024-06-21 NOTE — Telephone Encounter (Signed)
 Patient contacted the office and states she has decided to move forward with the TERIPARATIDE. [facility] [place] on [address].

## 2024-06-21 NOTE — Addendum Note (Signed)
 Addended by: DAYNE SHERRY RAMAN on: 06/21/2024 03:48 PM   Modules accepted: Orders

## 2024-07-18 ENCOUNTER — Ambulatory Visit: Admitting: Rheumatology

## 2024-07-18 ENCOUNTER — Telehealth: Payer: Self-pay | Admitting: *Deleted

## 2024-07-18 NOTE — Telephone Encounter (Signed)
 Patient contacted the office and states she started Teriparatide  on 06/23/2024. Patient states since starting she has had an over all feeling of not feeling well. Patient states she is very fatigued and has a decreased appetite. Patient states she has to force herself to eat. Patient states she has seen PCP and has ruled out infection. Patient states she has a follow up with PCP next week. Patient  had a kyphoplasty on 07/10/2024 but states she was feeling this way prior to that. Patient would like to know what she should do.

## 2024-07-19 NOTE — Telephone Encounter (Signed)
 It is not a common side effect of teriparatide .  However, each person can react differently.  I will give it more time to see if the symptoms are related to teriparatide  and if the symptoms improve.  There are not very many options for treatment.

## 2024-07-19 NOTE — Telephone Encounter (Signed)
 Patient advised it is not a common side effect of teriparatide . However, each person can react differently. Patient advised Dr. Dolphus will give it more time to see if the symptoms are related to teriparatide  and if the symptoms improve. There are not very many options for treatment. Patient expressed understanding.

## 2024-08-17 ENCOUNTER — Other Ambulatory Visit: Payer: Self-pay | Admitting: Rheumatology

## 2024-08-17 NOTE — Telephone Encounter (Signed)
 Last Fill: 04/06/2024  Eye exam: 02/03/2024   Labs: 06/13/2024 Synovial fluid is blood-tinged, crystals negative, sodium low and chloride low, CBC normal, vitamin D  normal at 61. Phosphorus normal, TSH normal, IFE normal, PTH normal.   Next Visit: 09/14/2024  Last Visit: 06/13/2024  DX: Rheumatoid arthritis involving multiple sites with positive rheumatoid factor   Current Dose per office note on 06/13/2024: Plaquenil  200 mg 1 tablet by mouth twice daily Monday through Friday.   Okay to refill Plaquenil ?

## 2024-09-14 ENCOUNTER — Ambulatory Visit: Admitting: Physician Assistant

## 2024-11-08 ENCOUNTER — Ambulatory Visit: Admitting: Physician Assistant
